# Patient Record
Sex: Female | Born: 1945
Health system: Southern US, Community
[De-identification: ages and names within clinical notes are randomized; demographics above are authoritative.]

## PROBLEM LIST (undated history)

## (undated) DIAGNOSIS — K439 Ventral hernia without obstruction or gangrene: Secondary | ICD-10-CM

## (undated) DIAGNOSIS — N393 Stress incontinence (female) (male): Secondary | ICD-10-CM

## (undated) DIAGNOSIS — R112 Nausea with vomiting, unspecified: Secondary | ICD-10-CM

## (undated) DIAGNOSIS — C801 Malignant (primary) neoplasm, unspecified: Secondary | ICD-10-CM

## (undated) DIAGNOSIS — Z8719 Personal history of other diseases of the digestive system: Secondary | ICD-10-CM

## (undated) DIAGNOSIS — Z9889 Other specified postprocedural states: Secondary | ICD-10-CM

## (undated) DIAGNOSIS — K7689 Other specified diseases of liver: Secondary | ICD-10-CM

## (undated) DIAGNOSIS — L900 Lichen sclerosus et atrophicus: Secondary | ICD-10-CM

## (undated) HISTORY — DX: Lichen sclerosus et atrophicus: L90.0

## (undated) HISTORY — DX: Malignant (primary) neoplasm, unspecified: C80.1

## (undated) HISTORY — DX: Stress incontinence (female) (male): N39.3

## (undated) HISTORY — PX: AUGMENTATION MAMMAPLASTY: SUR837

## (undated) HISTORY — PX: COLONOSCOPY: SHX174

## (undated) HISTORY — PX: FOOT SURGERY: SHX648

---

## 1987-08-09 HISTORY — PX: ABDOMINAL HYSTERECTOMY: SHX81

## 1995-09-09 HISTORY — PX: LIPOSUCTION: SHX10

## 1997-09-10 ENCOUNTER — Ambulatory Visit (HOSPITAL_COMMUNITY): Admission: RE | Admit: 1997-09-10 | Discharge: 1997-09-10 | Payer: Self-pay | Admitting: Obstetrics and Gynecology

## 1998-04-16 ENCOUNTER — Ambulatory Visit (HOSPITAL_COMMUNITY): Admission: RE | Admit: 1998-04-16 | Discharge: 1998-04-16 | Payer: Self-pay | Admitting: Gastroenterology

## 1999-02-22 ENCOUNTER — Other Ambulatory Visit: Admission: RE | Admit: 1999-02-22 | Discharge: 1999-02-22 | Payer: Self-pay | Admitting: Obstetrics and Gynecology

## 2002-03-04 ENCOUNTER — Other Ambulatory Visit: Admission: RE | Admit: 2002-03-04 | Discharge: 2002-03-04 | Payer: Self-pay | Admitting: Obstetrics and Gynecology

## 2004-01-09 ENCOUNTER — Ambulatory Visit (HOSPITAL_COMMUNITY): Admission: RE | Admit: 2004-01-09 | Discharge: 2004-01-09 | Payer: Self-pay | Admitting: Gastroenterology

## 2004-10-06 DIAGNOSIS — L900 Lichen sclerosus et atrophicus: Secondary | ICD-10-CM

## 2004-10-06 HISTORY — DX: Lichen sclerosus et atrophicus: L90.0

## 2005-08-23 ENCOUNTER — Emergency Department (HOSPITAL_COMMUNITY): Admission: EM | Admit: 2005-08-23 | Discharge: 2005-08-23 | Payer: Self-pay | Admitting: Emergency Medicine

## 2006-01-06 DIAGNOSIS — C801 Malignant (primary) neoplasm, unspecified: Secondary | ICD-10-CM

## 2006-01-06 HISTORY — DX: Malignant (primary) neoplasm, unspecified: C80.1

## 2006-01-26 ENCOUNTER — Encounter: Admission: RE | Admit: 2006-01-26 | Discharge: 2006-01-26 | Payer: Self-pay | Admitting: *Deleted

## 2006-02-03 ENCOUNTER — Encounter: Admission: RE | Admit: 2006-02-03 | Discharge: 2006-02-03 | Payer: Self-pay | Admitting: Surgery

## 2006-02-06 ENCOUNTER — Encounter (INDEPENDENT_AMBULATORY_CARE_PROVIDER_SITE_OTHER): Payer: Self-pay | Admitting: Specialist

## 2006-02-06 ENCOUNTER — Encounter: Admission: RE | Admit: 2006-02-06 | Discharge: 2006-02-06 | Payer: Self-pay | Admitting: Surgery

## 2006-02-06 ENCOUNTER — Ambulatory Visit (HOSPITAL_BASED_OUTPATIENT_CLINIC_OR_DEPARTMENT_OTHER): Admission: RE | Admit: 2006-02-06 | Discharge: 2006-02-06 | Payer: Self-pay | Admitting: Surgery

## 2007-02-22 HISTORY — PX: BREAST RECONSTRUCTION: SHX9

## 2008-01-11 ENCOUNTER — Ambulatory Visit: Payer: Self-pay | Admitting: Oncology

## 2008-03-31 ENCOUNTER — Ambulatory Visit: Payer: Self-pay | Admitting: Oncology

## 2008-04-01 LAB — CBC WITH DIFFERENTIAL/PLATELET
EOS%: 4.6 % (ref 0.0–7.0)
LYMPH%: 28 % (ref 14.0–48.0)
MCH: 31.9 pg (ref 26.0–34.0)
MCHC: 35.2 g/dL (ref 32.0–36.0)
MCV: 90.8 fL (ref 81.0–101.0)
MONO%: 9.3 % (ref 0.0–13.0)
Platelets: 248 10*3/uL (ref 145–400)
RBC: 4.44 10*6/uL (ref 3.70–5.32)
RDW: 12.4 % (ref 11.3–14.5)

## 2008-04-01 LAB — MORPHOLOGY: RBC Comments: NORMAL

## 2009-12-06 HISTORY — PX: BREAST SURGERY: SHX581

## 2010-03-03 ENCOUNTER — Encounter: Admission: RE | Admit: 2010-03-03 | Discharge: 2010-03-03 | Payer: Self-pay | Admitting: Radiology

## 2010-03-18 ENCOUNTER — Ambulatory Visit: Payer: Self-pay | Admitting: Oncology

## 2010-05-06 ENCOUNTER — Encounter (INDEPENDENT_AMBULATORY_CARE_PROVIDER_SITE_OTHER): Payer: Self-pay | Admitting: Surgery

## 2010-05-06 ENCOUNTER — Inpatient Hospital Stay (HOSPITAL_COMMUNITY): Admission: RE | Admit: 2010-05-06 | Discharge: 2010-05-10 | Payer: Self-pay | Admitting: Surgery

## 2010-10-21 LAB — CARDIAC PANEL(CRET KIN+CKTOT+MB+TROPI)
CK, MB: 2.4 ng/mL (ref 0.3–4.0)
CK, MB: 3.8 ng/mL (ref 0.3–4.0)
Relative Index: 0.5 (ref 0.0–2.5)
Total CK: 452 U/L — ABNORMAL HIGH (ref 7–177)
Total CK: 515 U/L — ABNORMAL HIGH (ref 7–177)
Troponin I: 0.01 ng/mL (ref 0.00–0.06)
Troponin I: 0.01 ng/mL (ref 0.00–0.06)
Troponin I: 0.02 ng/mL (ref 0.00–0.06)

## 2010-10-21 LAB — COMPREHENSIVE METABOLIC PANEL
Albumin: 2.8 g/dL — ABNORMAL LOW (ref 3.5–5.2)
Alkaline Phosphatase: 56 U/L (ref 39–117)
Alkaline Phosphatase: 97 U/L (ref 39–117)
BUN: 12 mg/dL (ref 6–23)
BUN: 6 mg/dL (ref 6–23)
CO2: 30 mEq/L (ref 19–32)
Chloride: 100 mEq/L (ref 96–112)
Chloride: 104 mEq/L (ref 96–112)
Creatinine, Ser: 0.59 mg/dL (ref 0.4–1.2)
GFR calc non Af Amer: >60 mL/min (ref >60)
GFR calc non Af Amer: >60 mL/min (ref >60)
Glucose, Bld: 128 mg/dL — ABNORMAL HIGH (ref 70–99)
Glucose, Bld: 96 mg/dL (ref 70–99)
Potassium: 3.6 mEq/L (ref 3.5–5.1)
Total Bilirubin: 0.4 mg/dL (ref 0.3–1.2)
Total Bilirubin: 0.5 mg/dL (ref 0.3–1.2)
Total Protein: 6.4 g/dL (ref 6.0–8.3)

## 2010-10-21 LAB — CBC
HCT: 28.4 % — ABNORMAL LOW (ref 36.0–46.0)
HCT: 28.5 % — ABNORMAL LOW (ref 36.0–46.0)
HCT: 31.7 % — ABNORMAL LOW (ref 36.0–46.0)
HCT: 41.5 % (ref 36.0–46.0)
Hemoglobin: 10.8 g/dL — ABNORMAL LOW (ref 12.0–15.0)
Hemoglobin: 9.6 g/dL — ABNORMAL LOW (ref 12.0–15.0)
MCH: 31.1 pg (ref 26.0–34.0)
MCHC: 33.7 g/dL (ref 30.0–36.0)
MCV: 91.2 fL (ref 78.0–100.0)
MCV: 93.1 fL (ref 78.0–100.0)
Platelets: 171 10*3/uL (ref 150–400)
RBC: 3.05 MIL/uL — ABNORMAL LOW (ref 3.87–5.11)
RBC: 3.12 MIL/uL — ABNORMAL LOW (ref 3.87–5.11)
RDW: 12.5 % (ref 11.5–15.5)
RDW: 12.6 % (ref 11.5–15.5)
WBC: 6 10*3/uL (ref 4.0–10.5)
WBC: 8.5 10*3/uL (ref 4.0–10.5)

## 2010-10-21 LAB — BASIC METABOLIC PANEL
CO2: 29 mEq/L (ref 19–32)
GFR calc non Af Amer: >60 mL/min (ref >60)
GFR calc non Af Amer: >60 mL/min (ref >60)
Glucose, Bld: 144 mg/dL — ABNORMAL HIGH (ref 70–99)
Potassium: 3.5 mEq/L (ref 3.5–5.1)
Potassium: 3.7 mEq/L (ref 3.5–5.1)
Sodium: 133 mEq/L — ABNORMAL LOW (ref 135–145)
Sodium: 137 mEq/L (ref 135–145)

## 2010-10-21 LAB — URINALYSIS, ROUTINE W REFLEX MICROSCOPIC
Bilirubin Urine: NEGATIVE
Glucose, UA: NEGATIVE mg/dL
Hgb urine dipstick: NEGATIVE
Specific Gravity, Urine: 1.008 (ref 1.005–1.030)
Urobilinogen, UA: 0.2 mg/dL (ref 0.0–1.0)
pH: 5.5 (ref 5.0–8.0)

## 2010-10-21 LAB — DIFFERENTIAL
Basophils Absolute: 0 10*3/uL (ref 0.0–0.1)
Basophils Relative: 1 % (ref 0–1)
Neutro Abs: 3.6 10*3/uL (ref 1.7–7.7)
Neutrophils Relative %: 59 % (ref 43–77)

## 2010-10-21 LAB — GLUCOSE, CAPILLARY
Glucose-Capillary: 104 mg/dL — ABNORMAL HIGH (ref 70–99)
Glucose-Capillary: 107 mg/dL — ABNORMAL HIGH (ref 70–99)
Glucose-Capillary: 109 mg/dL — ABNORMAL HIGH (ref 70–99)
Glucose-Capillary: 119 mg/dL — ABNORMAL HIGH (ref 70–99)
Glucose-Capillary: 120 mg/dL — ABNORMAL HIGH (ref 70–99)
Glucose-Capillary: 130 mg/dL — ABNORMAL HIGH (ref 70–99)

## 2010-10-21 LAB — SURGICAL PCR SCREEN: Staphylococcus aureus: NEGATIVE

## 2010-12-24 NOTE — Op Note (Signed)
NAMEADISSON, DEAK             ACCOUNT NO.:  [id number]   MEDICAL RECORD NO.:  [id number]          PATIENT TYPE:  AMB   LOCATION:  DSC                          FACILITY:  [facility]   PHYSICIAN:  Currie Paris, M.D.DATE OF BIRTH:  May 28, 1946   DATE OF PROCEDURE:  02/06/2006  DATE OF DISCHARGE:                                 OPERATIVE REPORT   CCS [id number]   PREOPERATIVE DIAGNOSIS:  Ductal carcinoma in situ right breast, upper outer  quadrant.   OPERATION:  Needle guided excision, right breast cancer.   SURGEON:  Dr. Jamey Ripa.   ANESTHESIA:  General.   CLINICAL HISTORY:  Ms. Walsh is a 60-year lady with a recently developed  area of calcifications in the upper outer right breast and a core biopsy  showed DCIS.  After lengthy discussion with the patient she elected to  proceed to needle guided excision with plans for radiation therapy  postoperatively.   DESCRIPTION OF PROCEDURE:  The patient was seen in the holding area and had  no further questions.  The guidewire already been placed in the films were  reviewed.  The right breast was marked as the operative side.   The patient was taken to the operating room and after satisfactory general  anesthesia had been obtained the right breast was prepped and draped.  The  time-out occurred.   The guidewire entered fairly laterally and tracked almost directly medially.  I made a curvilinear incision taking a small ellipse of skin so I could  include the prior biopsy site.  The guidewire went directly through that  biopsy site skin entry.   I raised flaps superiorly, then laterally going deep and then inferiorly.  I  then freed this up off of the chest wall and continued the dissection  medially and went well beyond the skin incision so that I was beyond the  guidewire tip.  This completely excised the area around the guidewire with  exception of a little thin rim of tissue deep over the pectoralis muscle and  her breast  implant.  In looking at the specimen I thought the area of  involvement was fairly close to the tip of the guidewire so once that was  out I took a second piece of tissue around where the guidewire was and to go  further medially and get an area of old hemorrhage which appeared to be from  her prior biopsy site where she had had a fairly large ecchymosis and I  wanted to make sure all that was out as well.  Finally I took a few  millimeters of tissue which was all that was left of the deep margin so that  I was right down on the muscle fascia and chest wall.   I made sure everything was dry.  I put four clips and to mark the  approximate margin of the excision.  I closed thr subcu with 3-0 Vicryl, the  skin with 4-0 Monocryl subcuticular and Dermabond.   Specimen mammogram indicated that we had a good positioning of that the  calcifications within the specimen.  Touch Preps  were not done as the  pathologist apparently put the tissue in Formalin not realizing we wished  touch preps.  We will therefore wait for permanent sections.   The patient tolerated procedure well.  There no operative complications.  All counts were correct.      Currie Paris, M.D.  Electronically Signed     CJS/MEDQ  D:  02/06/2006  T:  02/06/2006  Job:  [id number]   cc:   Loraine Leriche A. Perini, M.D.  Fax: [phone]

## 2011-05-09 HISTORY — PX: BREAST RECONSTRUCTION: SHX9

## 2011-06-02 ENCOUNTER — Ambulatory Visit (HOSPITAL_BASED_OUTPATIENT_CLINIC_OR_DEPARTMENT_OTHER)
Admission: RE | Admit: 2011-06-02 | Discharge: 2011-06-02 | Disposition: A | Discharge status: Home / Self Care | Payer: Medicare Other | Source: Ambulatory Visit | Attending: Plastic Surgery | Admitting: Plastic Surgery

## 2011-06-02 ENCOUNTER — Other Ambulatory Visit: Payer: Self-pay | Admitting: Plastic Surgery

## 2011-06-02 DIAGNOSIS — Z9010 Acquired absence of unspecified breast and nipple: Secondary | ICD-10-CM | POA: Insufficient documentation

## 2011-06-02 DIAGNOSIS — Z4430 Encounter for fitting and adjustment of external breast prosthesis, unspecified breast: Secondary | ICD-10-CM | POA: Insufficient documentation

## 2011-06-02 LAB — POCT HEMOGLOBIN-HEMACUE: Hemoglobin: 14.2 g/dL (ref 12.0–15.0)

## 2011-06-10 NOTE — Op Note (Signed)
NAMERAMANDA, PAULES NO.:  [id number]  MEDICAL RECORD NO.:  [id number]  LOCATION:                                 FACILITY:  PHYSICIAN:  Wayland Denis, DO      DATE OF BIRTH:  1946-05-06  DATE OF PROCEDURE:  06/02/2011 DATE OF DISCHARGE:                              OPERATIVE REPORT   PREOPERATIVE DIAGNOSIS:  Breast cancer.  POSTOPERATIVE DIAGNOSIS:  Breast cancer, status post mastectomies with reconstruction.  PROCEDURES: 1. Removal of bilateral tissue expanders of the breast with placement     of permanent implants. 2. Capsulectomy on the left.  ATTENDING SURGEON:  Wayland Denis, DO  ANESTHESIA:  General.  INDICATION FOR PROCEDURE:  The patient is a 65 year old female who underwent bilateral mastectomies for breast cancer.  She then had a right latissimus muscle reconstruction with expander placement and a left expander placement.  She presents today for removal of the expanders and placement of the implant.  DESCRIPTION OF PROCEDURE:  The patient was seen in the holding room. Risks and complications were reviewed.  Consent was confirmed.  Markings were made.  The patient was then taken to the operating room, placed on the operating room table in the supine position.  General anesthesia was administered.  Once adequate, a time-out was called.  All information was confirmed to be correct.  She was prepped and draped in the usual sterile fashion.  Surgery began on the left breast.  She had an inframammary incision from her previous surgeries.  This incision was used to approach the expander.  The scar was extremely thin and therefore not cut out.  The incision was made with a 15 blade.  The Bovie was then used to dissect down to the capsule and open the capsule. The expander was punctured and the saline was evacuated.  The expander was then removed.  There was lateralization of the expander and it had moved inferiorly.  Therefore, a capsulectomy  was done at the inframammary fold and at the lateral margin in order to raise the inframammary fold more superiorly and close the breast pocket laterally to keep the implant from moving lateral.  This was done with the Bovie to remove the capsule in those areas and then it was re-sutured with 2-0 PDS to itself with a running suture.  The pocket was irrigated with antibiotic solution.  The Sizer was then placed and amount of 540 mL was selected.  The Sizer was removed and the Allergan natural 168 implant was chosen, the size was 5-10, it was prepared according to the manufacture's guideline and was soaked in antibiotic solution.  The air was evacuated.  The implant was placed in the pocket and expanded to 540 mL of normal injectable saline.  The tubing was then used to evacuate any remaining air and then the tubing was removed.  The capsule was closed with 3-0 Vicryl.  The muscle was reapproximated with a Vicryl 3-0 and 4-0 Vicryl was then used to close the soft tissue.  The skin was reapproximated with 5-0 Monocryl.  Dermabond was then applied to the skin.  Attention was then turned to the right breast.  An incision was  made along the superior aspect of the latissimus flap because the patient desire to have it raised.  The skin on the breast was scarred because it had closed by secondary intention after her previous surgery. We were able to excise a portion of this about a cm of this in its thickness and a 6-cm wide in order to raise the flap more superiorly. This was done with a 15 blade to excise the skin and free the flap from the superior flap in order to raise the latissimus.  The decision was made to approach the expander from the inferior medial aspect where I was competent that there was a juncture between the latissimus and the chest wall.  This was approached with the Bovie.  The expander was removed by puncturing it and evacuating the fluid.  A Sizer was placed and inflated to  510 mL.  The Allergan natural 168 480 mL saline filled breast implant was chosen and prepared according to the manufacture guidelines, soaked in antibiotic solution.  The air was evacuated and the implant was placed in the pocket.  The implant was inflated to 510 mL with injectable normal saline.  The latissimus was reattached to the chest wall with 3-0 Vicryl.  The 3-0 Vicryl was used to elevate the latissimus more superiorly by plicating it to the pectoralis.  The deep layers were closed with 3-0 Vicryl and then a 4-0 Vicryl and a running subcuticular 5-0 Monocryl was used to close the skin.  Both pockets were dry.  No drains were placed.  The patient tolerated the procedure well. There were no complications.  ABDs were placed with a breast binder. The patient was awoken and taken to recovery room in stable.     Wayland Denis, DO     CS/MEDQ  D:  06/02/2011  T:  06/03/2011  Job:  [id number]  Electronically Signed by Wayland Denis  on 06/10/2011 09:39:20 AM

## 2012-05-22 ENCOUNTER — Ambulatory Visit
Admission: RE | Admit: 2012-05-22 | Discharge: 2012-05-22 | Disposition: A | Discharge status: Home / Self Care | Payer: Medicare Other | Source: Ambulatory Visit | Attending: Gastroenterology | Admitting: Gastroenterology

## 2012-05-22 ENCOUNTER — Other Ambulatory Visit: Payer: Self-pay | Admitting: Gastroenterology

## 2012-05-22 DIAGNOSIS — R140 Abdominal distension (gaseous): Secondary | ICD-10-CM

## 2012-06-08 LAB — HM COLONOSCOPY: HM Colonoscopy: NORMAL

## 2013-07-23 ENCOUNTER — Encounter: Payer: Self-pay | Admitting: Nurse Practitioner

## 2013-07-25 ENCOUNTER — Ambulatory Visit (INDEPENDENT_AMBULATORY_CARE_PROVIDER_SITE_OTHER): Payer: Medicare Other | Admitting: Nurse Practitioner

## 2013-07-25 ENCOUNTER — Encounter: Payer: Self-pay | Admitting: Nurse Practitioner

## 2013-07-25 VITALS — BP 130/82 | HR 80 | Resp 16 | Ht 64.5 in | Wt 138.0 lb

## 2013-07-25 DIAGNOSIS — D0590 Unspecified type of carcinoma in situ of unspecified breast: Secondary | ICD-10-CM | POA: Insufficient documentation

## 2013-07-25 DIAGNOSIS — N904 Leukoplakia of vulva: Secondary | ICD-10-CM

## 2013-07-25 DIAGNOSIS — E559 Vitamin D deficiency, unspecified: Secondary | ICD-10-CM | POA: Insufficient documentation

## 2013-07-25 DIAGNOSIS — Z01419 Encounter for gynecological examination (general) (routine) without abnormal findings: Secondary | ICD-10-CM

## 2013-07-25 DIAGNOSIS — L940 Localized scleroderma [morphea]: Secondary | ICD-10-CM

## 2013-07-25 DIAGNOSIS — D0591 Unspecified type of carcinoma in situ of right breast: Secondary | ICD-10-CM

## 2013-07-25 MED ORDER — ESTRADIOL 10 MCG VA TABS: 1.0000 | ORAL_TABLET | VAGINAL | Status: DC

## 2013-07-25 MED ORDER — FLUCONAZOLE 150 MG PO TABS: 150.0000 mg | ORAL_TABLET | Freq: Once | ORAL | Status: DC

## 2013-07-25 NOTE — Patient Instructions (Signed)

## 2013-07-25 NOTE — Progress Notes (Signed)
Patient ID: Krista Carney, female   DOB: [birth year], 67 y.o.   MRN: [id number] 67 y.o. G93P2002 Married Caucasian Fe here for annual exam.  No new health problems.  She continues on Vagifem and does use Replens prn.  She joined [employer] and has lost 20 lbs since last year. She feels well.  [place] the rest.  Occasionally has a little vaginal discharge and itching and request a refill of Diflucan.  She has not had a flare of LSA. Mother is 62 1/2 and doing quite well.  She walks on treadmill for 20 minutes every am.  Reads 12 books in 2 weeks.  No LMP recorded. Patient has had a hysterectomy.          Sexually active: yes  The current method of family planning is post menopausal status.    Exercising: yes  Home exercise routine includes walking 3-4 miles everyday, teaches weight training twice weekly. Smoker:  no  Health Maintenance: Pap:  03/04/02, WNL MMG:  Bilateral mastectomy Colonoscopy:  06/2012, normal, repeat 10 years BMD:  2011, normal TDaP:  UTD Labs: PCP   reports that she has never smoked. She has never used smokeless tobacco. She reports that she drinks about 4.2 ounces of alcohol per week. She reports that she does not use illicit drugs.  Past Medical History  Diagnosis Date  . SUI (stress urinary incontinence, female)   . Cancer 01/2006    DCIS Right Breast, lumpectomy  . Lichen sclerosus et atrophicus 10/2004    vulvar biopsy    Past Surgical History  Procedure Laterality Date  . Augmentation mammaplasty Bilateral prior to breast cancer    implants, silicone  . Breast reconstruction Bilateral 02/22/07  . Breast surgery Bilateral 12/2009    mastectomy  . Breast reconstruction Bilateral 05/2011  . Liposuction  2/97    incision repair  . Foot surgery Bilateral 1/97 & 1999    bunioectomy and hammer toe  . Abdominal hysterectomy  1989    abdominoplasty, endo/fibroids/adh  . Colonoscopy  fall 2013    normal recheck in 10 years     Current Outpatient Prescriptions  Medication Sig Dispense Refill  . aspirin EC 81 MG tablet Take 81 mg by mouth 2 (two) times a week.      . butalbital-acetaminophen-caffeine (FIORICET, ESGIC) 50-325-40 MG per tablet Take 1 tablet by mouth 2 (two) times daily as needed for headache.      . Coenzyme Q10 (CO Q 10 PO) Take 1 tablet by mouth daily.      Marland Kitchen dexlansoprazole (DEXILANT) 60 MG capsule Take 60 mg by mouth daily.      . Estradiol (VAGIFEM) 10 MCG TABS vaginal tablet Place 1 tablet (10 mcg total) vaginally 2 (two) times a week.  24 tablet  3  . traMADol (ULTRAM) 50 MG tablet Take 50 mg by mouth every 6 (six) hours as needed.      . vitamin C (ASCORBIC ACID) 500 MG tablet Take 500 mg by mouth daily.      Marland Kitchen zolpidem (AMBIEN) 5 MG tablet Take 2.5 mg by mouth at bedtime as needed for sleep.      . fluconazole (DIFLUCAN) 150 MG tablet Take 1 tablet (150 mg total) by mouth once. Take one tablet.  Repeat in 48 hours if symptoms are not completely resolved.  2 tablet  0  . MAGNESIUM PO Take 1 tablet by mouth daily.  No current facility-administered medications for this visit.    Family History  Problem Relation Age of Onset  . Osteoporosis Sister   . Tremor Sister   . Thyroid disease Mother   . Tremor Mother   . Tremor Sister   . Tremor Sister     ROS:  Pertinent items are noted in HPI.  Otherwise, a comprehensive ROS was negative.  Exam:   BP 130/82  Pulse 80  Resp 16  Ht 5' 4.5" (1.638 m)  Wt 138 lb (62.596 kg)  BMI 23.33 kg/m2 Height: 5' 4.5" (163.8 cm)  Ht Readings from Last 3 Encounters:  07/25/13 5' 4.5" (1.638 m)    General appearance: alert, cooperative and appears stated age Head: Normocephalic, without obvious abnormality, atraumatic Neck: no adenopathy, supple, symmetrical, trachea midline and thyroid normal to inspection and palpation Lungs: clear to auscultation bilaterally Breasts: positive findings: implants bilaterally, with bilateral mastectomy, then  implants and reconstruction surgerical scars. Heart: regular rate and rhythm Abdomen: soft, non-tender; no masses,  no organomegaly Extremities: extremities normal, atraumatic, no cyanosis or edema Skin: Skin color, texture, turgor normal. No rashes or lesions Lymph nodes: Cervical, supraclavicular, and axillary nodes normal. No abnormal inguinal nodes palpated Neurologic: Grossly normal   Pelvic: External genitalia:  no lesions, no flare of LSA              Urethra:  normal appearing urethra with no masses, tenderness or lesions              Bartholin's and Skene's: normal                 Vagina: normal appearing vagina with normal color and discharge, no lesions              Cervix: absent              Pap taken: no Bimanual Exam:  Uterus:  uterus absent              Adnexa: no mass, fullness, tenderness               Rectovaginal: Confirms               Anus:  normal sphincter tone, no lesions  A:  Well Woman with normal exam  S/P Bilateral mastectomy following DCIS on right 6/07 ER/ PR negative, treated with radiation. Later reconstruction bilaterally X 2   S/P TAH with abdominoplasty secondary to endo, fibroids, adhesions 1989  History of LSA - no current flare  P:   Pap smear as per guidelines  not indicated  Mammogram not indicated  Refill on Diflucan X 2 if needed  Reviewed use of OTC products to help with atrophy  Refill on Vagifem twice weekly  Prior authorization from surgeon and oncologist (Magrinat) that she could use vaginal estrogen.  She is again reviewed with potential risk and precautions.  Discussed potential risks of DVT, CVA, cancer, etc  She will return for Vit D - order is placed  Counseled on breast self exam, adequate intake of calcium and vitamin D, diet and exercise, Kegel's exercises return annually or prn  An After Visit Summary was printed and given to the patient.

## 2013-07-25 NOTE — Progress Notes (Signed)
Reviewed personally.  M. Suzanne Renald Haithcock, MD.  

## 2013-08-21 ENCOUNTER — Other Ambulatory Visit: Payer: Self-pay | Admitting: Nurse Practitioner

## 2013-08-21 NOTE — Telephone Encounter (Signed)
eScribe request from [facility] for refill on VAGIFEM Last filled - 07/25/13, #24 X 3 RF Last AEX - 07/25/13 Next AEX - 08/13/13 RX denied as this has already been done.

## 2013-08-28 ENCOUNTER — Other Ambulatory Visit: Payer: Medicare Other

## 2013-08-28 DIAGNOSIS — E559 Vitamin D deficiency, unspecified: Secondary | ICD-10-CM

## 2013-08-29 LAB — VITAMIN D 25 HYDROXY (VIT D DEFICIENCY, FRACTURES): Vit D, 25-Hydroxy: 62 ng/mL (ref 30–89)

## 2014-06-09 ENCOUNTER — Encounter: Payer: Self-pay | Admitting: Nurse Practitioner

## 2014-07-29 ENCOUNTER — Ambulatory Visit (INDEPENDENT_AMBULATORY_CARE_PROVIDER_SITE_OTHER): Payer: Medicare Other | Admitting: Nurse Practitioner

## 2014-07-29 ENCOUNTER — Encounter: Payer: Self-pay | Admitting: Nurse Practitioner

## 2014-07-29 VITALS — BP 136/84 | HR 64 | Ht 64.0 in | Wt 143.0 lb

## 2014-07-29 DIAGNOSIS — Z01419 Encounter for gynecological examination (general) (routine) without abnormal findings: Secondary | ICD-10-CM

## 2014-07-29 MED ORDER — ESTROGENS, CONJUGATED 0.625 MG/GM VA CREA: TOPICAL_CREAM | VAGINAL | Status: DC

## 2014-07-29 MED ORDER — FLUCONAZOLE 150 MG PO TABS: 150.0000 mg | ORAL_TABLET | Freq: Once | ORAL | Status: DC

## 2014-07-29 MED ORDER — CLOBETASOL PROP EMOLLIENT BASE 0.05 % EX CREA: 0.0500 "application " | TOPICAL_CREAM | Freq: Two times a day (BID) | CUTANEOUS | Status: DC

## 2014-07-29 NOTE — Progress Notes (Signed)
Patient ID: Krista Carney, female   DOB: Jul 28, 1946, 68 y.o.   MRN: [id number]  68 y.o. G23P2002 Married Caucasian Fe here for annual exam.  Some flare of LSA.  Uses Vagifem on and off this year but does better with Premarin she thinks.  Wants a refill on this.  From time to time needs Diflucan to calm down the LSA.  Second round of back pain and sees chiropractor.  Saw a neuro muscular massage therapist.    Patient's last menstrual period was 10/07/1987.        Sexually active: yes  The current method of family planning is S/P TAH.  Exercising: yes Home exercise routine includes walking 3-4 miles everyday, teaches weight training twice weekly. Smoker: no  Health Maintenance: Pap: 03/04/02, WNL MMG: Bilateral mastectomy, history of DCIS right breast 01/2006 Colonoscopy: 06/2012, normal, repeat 10 years BMD: 12/2013, Dr. Joylene Draft, normal per patient TDaP: UTD Shingles vaccine 2011 Prevnar 13 - 12/14 Labs: PCP   reports that she has never smoked. She has never used smokeless tobacco. She reports that she drinks about 4.2 oz of alcohol per week. She reports that she does not use illicit drugs.  Past Medical History  Diagnosis Date  . SUI (stress urinary incontinence, female)   . Cancer 01/2006    DCIS Right Breast, lumpectomy  . Lichen sclerosus et atrophicus 10/2004    vulvar biopsy    Past Surgical History  Procedure Laterality Date  . Augmentation mammaplasty Bilateral prior to breast cancer    implants, silicone  . Breast reconstruction Bilateral 02/22/07  . Breast surgery Bilateral 12/2009    mastectomy  . Breast reconstruction Bilateral 05/2011  . Liposuction  2/97    incision repair  . Foot surgery Bilateral 1/97 & 1999    bunioectomy and hammer toe  . Abdominal hysterectomy  1989    abdominoplasty, endo/fibroids/adh  . Colonoscopy  fall 2013    normal recheck in 10 years    Current Outpatient Prescriptions  Medication Sig Dispense Refill  . aspirin EC 81 MG  tablet Take 81 mg by mouth 2 (two) times a week.    . butalbital-acetaminophen-caffeine (FIORICET, ESGIC) 50-325-40 MG per tablet Take 1 tablet by mouth 2 (two) times daily as needed for headache.    . Coenzyme Q10 (CO Q 10 PO) Take 1 tablet by mouth daily.    Marland Kitchen dexlansoprazole (DEXILANT) 60 MG capsule Take 60 mg by mouth daily.    . fluconazole (DIFLUCAN) 150 MG tablet Take 1 tablet (150 mg total) by mouth once. Take one tablet.  Repeat in 48 hours if symptoms are not completely resolved. 2 tablet 0  . MAGNESIUM PO Take 1 tablet by mouth daily.    . traMADol (ULTRAM) 50 MG tablet Take 50 mg by mouth every 6 (six) hours as needed.    . vitamin C (ASCORBIC ACID) 500 MG tablet Take 1,000 mg by mouth daily.     Marland Kitchen zolpidem (AMBIEN) 5 MG tablet Take 2.5 mg by mouth at bedtime as needed for sleep.    . Clobetasol Prop Emollient Base 0.05 % emollient cream Apply 6.30 application topically 2 (two) times daily. 60 g 2  . conjugated estrogens (PREMARIN) vaginal cream Use 1/2 g vaginally twice weekly 42.5 g 3   No current facility-administered medications for this visit.    Family History  Problem Relation Age of Onset  . Osteoporosis Sister   . Tremor Sister   . Thyroid disease Mother   .  Tremor Mother   . Tremor Sister   . Tremor Sister     ROS:  Pertinent items are noted in HPI.  Otherwise, a comprehensive ROS was negative.  Exam:   BP 136/84 mmHg  Pulse 64  Ht 5\' 4"  (1.626 m)  Wt 143 lb (64.864 kg)  BMI 24.53 kg/m2  LMP 10/07/1987 Height: 5\' 4"  (162.6 cm)  Ht Readings from Last 3 Encounters:  07/29/14 5\' 4"  (1.626 m)  07/25/13 5' 4.5" (1.638 m)    General appearance: alert, cooperative and appears stated age Head: Normocephalic, without obvious abnormality, atraumatic Neck: no adenopathy, supple, symmetrical, trachea midline and thyroid normal to inspection and palpation Lungs: clear to auscultation bilaterally Breasts: bilateral implants that are hard, no obvious mass Heart:  regular rate and rhythm Abdomen: soft, non-tender; no masses,  no organomegaly Extremities: extremities normal, atraumatic, no cyanosis or edema Skin: Skin color, texture, turgor normal. No rashes or lesions Lymph nodes: Cervical, supraclavicular, and axillary nodes normal. No abnormal inguinal nodes palpated Neurologic: Grossly normal   Pelvic: External genitalia:  no lesions              Urethra:  normal appearing urethra with no masses, tenderness or lesions              Bartholin's and Skene's: normal                 Vagina: normal appearing vagina with normal color and discharge, no lesions              Cervix: absent              Pap taken: No. Bimanual Exam:  Uterus:  uterus absent              Adnexa: no mass, fullness, tenderness               Rectovaginal: Confirms               Anus:  normal sphincter tone, no lesions  A:  Well Woman with normal exam  S/P Bilateral mastectomy following DCIS on right 6/07 ER/ PR negative, treated with radiation. Later reconstruction bilaterally X 2  S/P TAH with abdominoplasty secondary to endo, fibroids, adhesions 1989 History of LSA - slight current flare  History of low back pain   P:   Reviewed health and wellness pertinent to exam  Pap smear not taken today  Mammogram is not indicated  Refill on Temovate cream - has some for now  Refill on Premarin cream to use at the meatus and introitus  Counseled with risk of CVA, DVT,cancer, etc.  Counseled on breast self exam, use and side effects of HRT, adequate intake of calcium and vitamin D, diet and exercise, Kegel's exercises return annually or prn  An After Visit Summary was printed and given to the patient.

## 2014-07-29 NOTE — Patient Instructions (Signed)

## 2014-08-04 NOTE — Progress Notes (Signed)
Encounter reviewed by Dr. Gurjot Brisco Silva.  

## 2014-08-11 ENCOUNTER — Ambulatory Visit: Payer: Medicare Other | Admitting: Nurse Practitioner

## 2014-08-20 ENCOUNTER — Ambulatory Visit (HOSPITAL_COMMUNITY)
Admission: RE | Admit: 2014-08-20 | Discharge: 2014-08-20 | Disposition: A | Discharge status: Home / Self Care | Payer: PPO | Source: Ambulatory Visit | Attending: Vascular Surgery | Admitting: Vascular Surgery

## 2014-08-20 ENCOUNTER — Other Ambulatory Visit (HOSPITAL_COMMUNITY): Payer: Self-pay | Admitting: Internal Medicine

## 2014-08-20 DIAGNOSIS — M79606 Pain in leg, unspecified: Secondary | ICD-10-CM

## 2015-07-31 ENCOUNTER — Ambulatory Visit (INDEPENDENT_AMBULATORY_CARE_PROVIDER_SITE_OTHER): Payer: PPO | Admitting: Nurse Practitioner

## 2015-07-31 ENCOUNTER — Encounter: Payer: Self-pay | Admitting: Nurse Practitioner

## 2015-07-31 VITALS — BP 134/78 | HR 72 | Ht 64.25 in | Wt 143.0 lb

## 2015-07-31 DIAGNOSIS — Z0000 Encounter for general adult medical examination without abnormal findings: Secondary | ICD-10-CM

## 2015-07-31 DIAGNOSIS — Z01419 Encounter for gynecological examination (general) (routine) without abnormal findings: Secondary | ICD-10-CM | POA: Diagnosis not present

## 2015-07-31 DIAGNOSIS — R300 Dysuria: Secondary | ICD-10-CM

## 2015-07-31 LAB — POCT URINALYSIS DIPSTICK
BILIRUBIN UA: NEGATIVE
Blood, UA: NEGATIVE
Glucose, UA: NEGATIVE
Ketones, UA: NEGATIVE
LEUKOCYTES UA: NEGATIVE
NITRITE UA: NEGATIVE
PH UA: 7
Protein, UA: NEGATIVE
UROBILINOGEN UA: NEGATIVE

## 2015-07-31 MED ORDER — CLOBETASOL PROP EMOLLIENT BASE 0.05 % EX CREA: 0.0500 "application " | TOPICAL_CREAM | Freq: Two times a day (BID) | CUTANEOUS | Status: DC

## 2015-07-31 NOTE — Progress Notes (Signed)
Patient ID: Krista Carney, female   DOB: 1946-06-01, 69 y.o.   MRN: [id number]  69 y.o. G27P2002 Married  Caucasian Fe here for annual exam.  No new health problems.   Patient's last menstrual period was 10/07/1987 (approximate).          Sexually active: Yes.    The current method of family planning is status post hysterectomy.    Exercising: Yes.    walking and stretching Smoker:  no  Health Maintenance: Pap:  03/04/02, Negative MMG:  Not indicated, last Mammo 03/2010 and since then bilateral mastectomies Colonoscopy:  06/2012, normal, repeat in 10 years BMD:   12/2014, normal TD:  UTD with PCP, TDaP was recommended Shingles: Completed with PCP, unsure date Pneumonia: Completed with PCP, unsure date Hep C and HIV: done today Labs:  PCP takes care of labs  Urine: negative   reports that she has never smoked. She has never used smokeless tobacco. She reports that she drinks about 4.2 oz of alcohol per week. She reports that she does not use illicit drugs.  Past Medical History  Diagnosis Date  . SUI (stress urinary incontinence, female)   . Cancer ([place]) 01/2006    DCIS Right Breast, lumpectomy  . Lichen sclerosus et atrophicus 10/2004    vulvar biopsy    Past Surgical History  Procedure Laterality Date  . Augmentation mammaplasty Bilateral prior to breast cancer    implants, silicone  . Breast reconstruction Bilateral 02/22/07  . Breast surgery Bilateral 12/2009    mastectomy  . Breast reconstruction Bilateral 05/2011  . Liposuction  2/97    incision repair  . Foot surgery Bilateral 1/97 & 1999    bunioectomy and hammer toe  . Abdominal hysterectomy  1989    abdominoplasty, endo/fibroids/adh  . Colonoscopy  fall 2013    normal recheck in 10 years    Current Outpatient Prescriptions  Medication Sig Dispense Refill  . aspirin EC 81 MG tablet Take 81 mg by mouth 2 (two) times a week.    . butalbital-acetaminophen-caffeine (FIORICET, ESGIC) 50-325-40 MG per tablet Take 1  tablet by mouth 2 (two) times daily as needed for headache.    . Clobetasol Prop Emollient Base 0.05 % emollient cream Apply [id number] application topically 2 (two) times daily. 60 g 2  . Coenzyme Q10 (CO Q 10 PO) Take 1 tablet by mouth daily.    Marland Kitchen MAGNESIUM PO Take 1 tablet by mouth daily.    . traMADol (ULTRAM) 50 MG tablet Take 50 mg by mouth every 6 (six) hours as needed.    . vitamin C (ASCORBIC ACID) 500 MG tablet Take 1,000 mg by mouth daily.     Marland Kitchen zolpidem (AMBIEN) 5 MG tablet Take 2.5 mg by mouth at bedtime as needed for sleep.     No current facility-administered medications for this visit.    Family History  Problem Relation Age of Onset  . Osteoporosis Sister   . Tremor Sister   . Thyroid disease Mother   . Tremor Mother   . Tremor Sister   . Tremor Sister     ROS:  Pertinent items are noted in HPI.  Otherwise, a comprehensive ROS was negative.  Exam:   BP 134/78 mmHg  Pulse 72  Ht 5' 4.25" (1.632 m)  Wt 143 lb (64.864 kg)  BMI 24.35 kg/m2  LMP 10/07/1987 (Approximate) Height: 5' 4.25" (163.2 cm) Ht Readings from Last 3 Encounters:  07/31/15 5' 4.25" (1.632 m)  07/29/14 5'  4" (1.626 m)  07/25/13 5' 4.5" (1.638 m)    General appearance: alert, cooperative and appears stated age Head: Normocephalic, without obvious abnormality, atraumatic Neck: no adenopathy, supple, symmetrical, trachea midline and thyroid normal to inspection and palpation Lungs: clear to auscultation bilaterally Breasts: breast implants without new mass Heart: regular rate and rhythm Abdomen: soft, non-tender; no masses,  no organomegaly Extremities: extremities normal, atraumatic, no cyanosis or edema Skin: Skin color, texture, turgor normal. No rashes or lesions Lymph nodes: Cervical, supraclavicular, and axillary nodes normal. No abnormal inguinal nodes palpated Neurologic: Grossly normal   Pelvic: External genitalia:  no lesions              Urethra:  normal appearing urethra with no  masses, tenderness or lesions              Bartholin's and Skene's: normal                 Vagina: normal appearing vagina with normal color and discharge, no lesions              Cervix: absent              Pap taken: No. Bimanual Exam:  Uterus:  uterus absent              Adnexa: no mass, fullness, tenderness               Rectovaginal: Confirms               Anus:  normal sphincter tone, no lesions  Chaperone present: no  A:  Well Woman with normal exam  S/P Bilateral mastectomy following DCIS on right 6/07 ER/ PR negative, treated with radiation. Later reconstruction bilaterally X 2  S/P TAH with abdominoplasty secondary to endo, fibroids, adhesions 1989 History of LSA - slight current flare History of low back pain  R/O UTI   P:   Reviewed health and wellness pertinent to exam  Pap smear as above  Will get routine labs and urine culture to R/O infection  Counseled on breast self exam, adequate intake of calcium and vitamin D, diet and exercise return annually or prn  An After Visit Summary was printed and given to the patient.

## 2015-07-31 NOTE — Patient Instructions (Addendum)

## 2015-08-01 LAB — URINE CULTURE
COLONY COUNT: NO GROWTH
ORGANISM ID, BACTERIA: NO GROWTH

## 2015-08-01 LAB — HIV ANTIBODY (ROUTINE TESTING W REFLEX): HIV: NONREACTIVE

## 2015-08-01 LAB — HEPATITIS C ANTIBODY: HCV Ab: NEGATIVE

## 2015-08-06 NOTE — Progress Notes (Signed)
Encounter reviewed Oneka Parada, MD   

## 2015-08-19 DIAGNOSIS — R52 Pain, unspecified: Secondary | ICD-10-CM | POA: Diagnosis not present

## 2015-08-19 DIAGNOSIS — Z6824 Body mass index (BMI) 24.0-24.9, adult: Secondary | ICD-10-CM | POA: Diagnosis not present

## 2015-08-19 DIAGNOSIS — H6123 Impacted cerumen, bilateral: Secondary | ICD-10-CM | POA: Diagnosis not present

## 2015-08-19 DIAGNOSIS — J209 Acute bronchitis, unspecified: Secondary | ICD-10-CM | POA: Diagnosis not present

## 2015-08-19 DIAGNOSIS — R509 Fever, unspecified: Secondary | ICD-10-CM | POA: Diagnosis not present

## 2015-09-09 DIAGNOSIS — E784 Other hyperlipidemia: Secondary | ICD-10-CM | POA: Diagnosis not present

## 2015-09-09 DIAGNOSIS — R7301 Impaired fasting glucose: Secondary | ICD-10-CM | POA: Diagnosis not present

## 2015-09-16 DIAGNOSIS — K648 Other hemorrhoids: Secondary | ICD-10-CM | POA: Diagnosis not present

## 2015-09-16 DIAGNOSIS — M5136 Other intervertebral disc degeneration, lumbar region: Secondary | ICD-10-CM | POA: Diagnosis not present

## 2015-09-16 DIAGNOSIS — K589 Irritable bowel syndrome without diarrhea: Secondary | ICD-10-CM | POA: Diagnosis not present

## 2015-09-16 DIAGNOSIS — N952 Postmenopausal atrophic vaginitis: Secondary | ICD-10-CM | POA: Diagnosis not present

## 2015-09-16 DIAGNOSIS — B0229 Other postherpetic nervous system involvement: Secondary | ICD-10-CM | POA: Diagnosis not present

## 2015-09-16 DIAGNOSIS — Z0000 Encounter for general adult medical examination without abnormal findings: Secondary | ICD-10-CM | POA: Diagnosis not present

## 2015-09-16 DIAGNOSIS — Z1389 Encounter for screening for other disorder: Secondary | ICD-10-CM | POA: Diagnosis not present

## 2015-09-16 DIAGNOSIS — Z1212 Encounter for screening for malignant neoplasm of rectum: Secondary | ICD-10-CM | POA: Diagnosis not present

## 2015-09-16 DIAGNOSIS — Z6824 Body mass index (BMI) 24.0-24.9, adult: Secondary | ICD-10-CM | POA: Diagnosis not present

## 2015-09-16 DIAGNOSIS — R7301 Impaired fasting glucose: Secondary | ICD-10-CM | POA: Diagnosis not present

## 2015-09-16 DIAGNOSIS — E784 Other hyperlipidemia: Secondary | ICD-10-CM | POA: Diagnosis not present

## 2015-09-16 DIAGNOSIS — R51 Headache: Secondary | ICD-10-CM | POA: Diagnosis not present

## 2015-09-16 DIAGNOSIS — Z23 Encounter for immunization: Secondary | ICD-10-CM | POA: Diagnosis not present

## 2015-11-18 DIAGNOSIS — H25013 Cortical age-related cataract, bilateral: Secondary | ICD-10-CM | POA: Diagnosis not present

## 2015-12-17 DIAGNOSIS — X32XXXA Exposure to sunlight, initial encounter: Secondary | ICD-10-CM | POA: Diagnosis not present

## 2015-12-17 DIAGNOSIS — D2272 Melanocytic nevi of left lower limb, including hip: Secondary | ICD-10-CM | POA: Diagnosis not present

## 2015-12-17 DIAGNOSIS — Z08 Encounter for follow-up examination after completed treatment for malignant neoplasm: Secondary | ICD-10-CM | POA: Diagnosis not present

## 2015-12-17 DIAGNOSIS — Z85828 Personal history of other malignant neoplasm of skin: Secondary | ICD-10-CM | POA: Diagnosis not present

## 2015-12-17 DIAGNOSIS — D2262 Melanocytic nevi of left upper limb, including shoulder: Secondary | ICD-10-CM | POA: Diagnosis not present

## 2015-12-17 DIAGNOSIS — L570 Actinic keratosis: Secondary | ICD-10-CM | POA: Diagnosis not present

## 2015-12-17 DIAGNOSIS — D2261 Melanocytic nevi of right upper limb, including shoulder: Secondary | ICD-10-CM | POA: Diagnosis not present

## 2015-12-17 DIAGNOSIS — L821 Other seborrheic keratosis: Secondary | ICD-10-CM | POA: Diagnosis not present

## 2015-12-17 DIAGNOSIS — D2271 Melanocytic nevi of right lower limb, including hip: Secondary | ICD-10-CM | POA: Diagnosis not present

## 2015-12-17 DIAGNOSIS — L814 Other melanin hyperpigmentation: Secondary | ICD-10-CM | POA: Diagnosis not present

## 2015-12-17 DIAGNOSIS — D485 Neoplasm of uncertain behavior of skin: Secondary | ICD-10-CM | POA: Diagnosis not present

## 2015-12-17 DIAGNOSIS — D225 Melanocytic nevi of trunk: Secondary | ICD-10-CM | POA: Diagnosis not present

## 2015-12-23 DIAGNOSIS — M25562 Pain in left knee: Secondary | ICD-10-CM | POA: Diagnosis not present

## 2016-06-13 DIAGNOSIS — D225 Melanocytic nevi of trunk: Secondary | ICD-10-CM | POA: Diagnosis not present

## 2016-06-13 DIAGNOSIS — Z85828 Personal history of other malignant neoplasm of skin: Secondary | ICD-10-CM | POA: Diagnosis not present

## 2016-06-13 DIAGNOSIS — L814 Other melanin hyperpigmentation: Secondary | ICD-10-CM | POA: Diagnosis not present

## 2016-06-13 DIAGNOSIS — B079 Viral wart, unspecified: Secondary | ICD-10-CM | POA: Diagnosis not present

## 2016-06-13 DIAGNOSIS — L578 Other skin changes due to chronic exposure to nonionizing radiation: Secondary | ICD-10-CM | POA: Diagnosis not present

## 2016-06-13 DIAGNOSIS — X32XXXA Exposure to sunlight, initial encounter: Secondary | ICD-10-CM | POA: Diagnosis not present

## 2016-06-13 DIAGNOSIS — Z08 Encounter for follow-up examination after completed treatment for malignant neoplasm: Secondary | ICD-10-CM | POA: Diagnosis not present

## 2016-06-13 DIAGNOSIS — L309 Dermatitis, unspecified: Secondary | ICD-10-CM | POA: Diagnosis not present

## 2016-06-13 DIAGNOSIS — D2261 Melanocytic nevi of right upper limb, including shoulder: Secondary | ICD-10-CM | POA: Diagnosis not present

## 2016-06-13 DIAGNOSIS — D2271 Melanocytic nevi of right lower limb, including hip: Secondary | ICD-10-CM | POA: Diagnosis not present

## 2016-06-13 DIAGNOSIS — D2262 Melanocytic nevi of left upper limb, including shoulder: Secondary | ICD-10-CM | POA: Diagnosis not present

## 2016-06-13 DIAGNOSIS — L570 Actinic keratosis: Secondary | ICD-10-CM | POA: Diagnosis not present

## 2016-06-13 DIAGNOSIS — D485 Neoplasm of uncertain behavior of skin: Secondary | ICD-10-CM | POA: Diagnosis not present

## 2016-06-13 DIAGNOSIS — C50912 Malignant neoplasm of unspecified site of left female breast: Secondary | ICD-10-CM | POA: Diagnosis not present

## 2016-06-13 DIAGNOSIS — D2272 Melanocytic nevi of left lower limb, including hip: Secondary | ICD-10-CM | POA: Diagnosis not present

## 2016-06-13 DIAGNOSIS — L821 Other seborrheic keratosis: Secondary | ICD-10-CM | POA: Diagnosis not present

## 2016-07-05 DIAGNOSIS — J0100 Acute maxillary sinusitis, unspecified: Secondary | ICD-10-CM | POA: Diagnosis not present

## 2016-07-05 DIAGNOSIS — Z6825 Body mass index (BMI) 25.0-25.9, adult: Secondary | ICD-10-CM | POA: Diagnosis not present

## 2016-07-05 DIAGNOSIS — R05 Cough: Secondary | ICD-10-CM | POA: Diagnosis not present

## 2016-07-20 DIAGNOSIS — Z23 Encounter for immunization: Secondary | ICD-10-CM | POA: Diagnosis not present

## 2016-08-16 ENCOUNTER — Ambulatory Visit (INDEPENDENT_AMBULATORY_CARE_PROVIDER_SITE_OTHER): Payer: PPO | Admitting: Nurse Practitioner

## 2016-08-16 ENCOUNTER — Encounter: Payer: Self-pay | Admitting: Nurse Practitioner

## 2016-08-16 VITALS — BP 134/72 | HR 64 | Ht 64.0 in | Wt 147.0 lb

## 2016-08-16 DIAGNOSIS — Z171 Estrogen receptor negative status [ER-]: Secondary | ICD-10-CM

## 2016-08-16 DIAGNOSIS — C50211 Malignant neoplasm of upper-inner quadrant of right female breast: Secondary | ICD-10-CM

## 2016-08-16 DIAGNOSIS — Z01411 Encounter for gynecological examination (general) (routine) with abnormal findings: Secondary | ICD-10-CM

## 2016-08-16 DIAGNOSIS — N904 Leukoplakia of vulva: Secondary | ICD-10-CM | POA: Diagnosis not present

## 2016-08-16 MED ORDER — CLOBETASOL PROP EMOLLIENT BASE 0.05 % EX CREA: 0.0500 "application " | TOPICAL_CREAM | Freq: Two times a day (BID) | CUTANEOUS | 2 refills | Status: DC

## 2016-08-16 NOTE — Patient Instructions (Signed)

## 2016-08-16 NOTE — Progress Notes (Signed)
Patient ID: Krista Carney, female   DOB: 1946-05-03, 71 y.o.   MRN: [id number]  72 y.o. G1P2002 Married  Caucasian Fe here for annual exam.  Pt complains of some vulvar irritation.  She has not used Clobetasol for some time.  Thought maybe related to being just very dry.  Not SA. No discharge.  No new health problems.  Patient's last menstrual period was 10/07/1987 (approximate).          Sexually active: Yes.    The current method of family planning is status post hysterectomy (OV remain)    Exercising: Yes.    Home exercise routine includes walking, weights and stretching. Smoker:  no  Health Maintenance: Pap:  03/04/02, Negative MMG:  Not indicated, last Mammo 03/2010 and since then bilateral mastectomies Colonoscopy:  06/2012, normal, repeat in 10 years BMD: 2017, Normal per patient, Dr. Joylene Draft TDaP: 12/15/08, 07/20/2016 Shingles: 11/15/07 Pneumonia: 07/08/13 Prevnar-13, 12/10/09 Hep C: 07/31/15 HIV: Not indicated due to age Labs: PCP takes care of all labs   reports that she has never smoked. She has never used smokeless tobacco. She reports that she drinks about 4.2 oz of alcohol per week . She reports that she does not use drugs.  Past Medical History:  Diagnosis Date  . Cancer ([place]) 01/2006   DCIS Right Breast, lumpectomy  . Lichen sclerosus et atrophicus 10/2004   vulvar biopsy  . SUI (stress urinary incontinence, female)     Past Surgical History:  Procedure Laterality Date  . ABDOMINAL HYSTERECTOMY  1989   abdominoplasty, endo/fibroids/adh  . AUGMENTATION MAMMAPLASTY Bilateral prior to breast cancer   implants, silicone  . BREAST RECONSTRUCTION Bilateral 02/22/07  . BREAST RECONSTRUCTION Bilateral 05/2011  . BREAST SURGERY Bilateral 12/2009   mastectomy  . COLONOSCOPY  fall 2013   normal recheck in 10 years  . FOOT SURGERY Bilateral 1/97 & 1999   bunioectomy and hammer toe  . LIPOSUCTION  2/97   incision repair    Current Outpatient Prescriptions  Medication Sig  Dispense Refill  . aspirin EC 81 MG tablet Take 81 mg by mouth 2 (two) times a week.    . butalbital-acetaminophen-caffeine (FIORICET, ESGIC) 50-325-40 MG per tablet Take 1 tablet by mouth 2 (two) times daily as needed for headache.    . Clobetasol Prop Emollient Base 0.05 % emollient cream Apply [id number] application topically 2 (two) times daily. 60 g 2  . Coenzyme Q10 (CO Q 10 PO) Take 1 tablet by mouth daily.    Marland Kitchen MAGNESIUM PO Take 1 tablet by mouth daily.    . traMADol (ULTRAM) 50 MG tablet Take 50 mg by mouth every 6 (six) hours as needed.    . vitamin C (ASCORBIC ACID) 500 MG tablet Take 1,000 mg by mouth daily.     Marland Kitchen zolpidem (AMBIEN) 5 MG tablet Take 2.5 mg by mouth at bedtime as needed for sleep.     No current facility-administered medications for this visit.     Family History  Problem Relation Age of Onset  . Osteoporosis Sister   . Tremor Sister   . Thyroid disease Mother   . Tremor Mother   . Tremor Sister   . Tremor Sister     ROS:  Pertinent items are noted in HPI.  Otherwise, a comprehensive ROS was negative.  Exam:   BP 134/72 (BP Location: Right Arm, Patient Position: Sitting, Cuff Size: Normal)   Pulse 64   Ht 5\' 4"  (1.626 m)   Wt  147 lb (66.7 kg)   LMP 10/07/1987 (Approximate)   BMI 25.23 kg/m  Height: 5\' 4"  (162.6 cm) Ht Readings from Last 3 Encounters:  08/16/16 5\' 4"  (1.626 m)  07/31/15 5' 4.25" (1.632 m)  07/29/14 5\' 4"  (1.626 m)    General appearance: alert, cooperative and appears stated age Head: Normocephalic, without obvious abnormality, atraumatic Neck: no adenopathy, supple, symmetrical, trachea midline and thyroid normal to inspection and palpation Lungs: clear to auscultation bilaterally Breasts: positive findings: implants bilaterally and with surgical and radiation changes = implant is worse on right at site of previous cancer. Heart: regular rate and rhythm Abdomen: soft, non-tender; no masses,  no organomegaly Extremities: extremities  normal, atraumatic, no cyanosis or edema Skin: Skin color, texture, turgor normal. No rashes or lesions Lymph nodes: Cervical, supraclavicular, and axillary nodes normal. No abnormal inguinal nodes palpated Neurologic: Grossly normal   Pelvic: External genitalia:  Lesions with current flare of LSA along both labia minora              Urethra:  normal appearing urethra with no masses, tenderness or lesions              Bartholin's and Skene's: normal                 Vagina: normal appearing vagina with normal color and discharge, no lesions              Cervix: absent              Pap taken: No. Bimanual Exam:  Uterus:  uterus absent              Adnexa: no mass, fullness, tenderness               Rectovaginal: Confirms               Anus:  normal sphincter tone, no lesions  Chaperone present: yes  A:  Well Woman with normal exam   S/P Bilateral mastectomy following DCIS on right 6/07 ER/ PR negative, treated with radiation. Later reconstruction bilaterally X 2  S/P TAH with abdominoplasty secondary to endo, fibroids, adhesions 1989 History of LSA - with current flare History of low back pain - better with chiropractic care             History of Vit D deficiency - PCP now follows   P:   Reviewed health and wellness pertinent to exam  Pap smear not indicated  Refill on Clobetasol to use BID for a week with current flare then prn  She will apply coconut oil to vulvar areas daily  Counseled on adequate intake of calcium and vitamin D, diet and exercise, Kegel's exercises return annually or prn  An After Visit Summary was printed and given to the patient.

## 2016-08-18 ENCOUNTER — Telehealth: Payer: Self-pay | Admitting: Nurse Practitioner

## 2016-08-18 NOTE — Telephone Encounter (Signed)
Spoke with patient. Patient states that she went to pick up her rx for Clobetasol and it is going to be $85. Patient would like an alternative recommendations from Kem Boroughs, FNP to reduce cost. Advised I will review with Kem Boroughs, FNP and return call. Patient is okay for RN to speak with Kem Boroughs, FNP upon return to the office tomorrow.

## 2016-08-18 NOTE — Telephone Encounter (Signed)
Patient would like to speak with nurse about a prescription. 

## 2016-08-19 ENCOUNTER — Other Ambulatory Visit: Payer: Self-pay | Admitting: Nurse Practitioner

## 2016-08-19 MED ORDER — CLOBETASOL PROPIONATE 0.05 % EX OINT: 1.0000 "application " | TOPICAL_OINTMENT | Freq: Two times a day (BID) | CUTANEOUS | 1 refills | Status: DC

## 2016-08-19 NOTE — Telephone Encounter (Signed)
New order for Temovate which may be cheaper.  To call the pharmacy and call back if expensive.

## 2016-08-19 NOTE — Telephone Encounter (Signed)
Patient returning call. Says she picked up the new prescription which was cheaper.

## 2016-08-19 NOTE — Telephone Encounter (Signed)
Rx for Clobetasol Prop Emollient Base 0.05% emollient cream discontinued as the patient filled rx for Temovate .05 % ointment.  Routing to provider for final review. Patient agreeable to disposition. Will close encounter.

## 2016-08-19 NOTE — Telephone Encounter (Signed)
Spoke with patient. Advised of message as seen below from Krista Carney, [place]. Patient will check with her pharmacy to see which prescription is more cost effective and return call.

## 2016-08-21 NOTE — Progress Notes (Signed)
Encounter reviewed by Dr. Brook Amundson C. Silva.  

## 2016-09-21 DIAGNOSIS — E784 Other hyperlipidemia: Secondary | ICD-10-CM | POA: Diagnosis not present

## 2016-09-21 DIAGNOSIS — R7301 Impaired fasting glucose: Secondary | ICD-10-CM | POA: Diagnosis not present

## 2016-09-21 DIAGNOSIS — Z0000 Encounter for general adult medical examination without abnormal findings: Secondary | ICD-10-CM | POA: Diagnosis not present

## 2016-09-28 DIAGNOSIS — R011 Cardiac murmur, unspecified: Secondary | ICD-10-CM | POA: Diagnosis not present

## 2016-09-28 DIAGNOSIS — D0590 Unspecified type of carcinoma in situ of unspecified breast: Secondary | ICD-10-CM | POA: Diagnosis not present

## 2016-09-28 DIAGNOSIS — H6123 Impacted cerumen, bilateral: Secondary | ICD-10-CM | POA: Diagnosis not present

## 2016-09-28 DIAGNOSIS — Z0000 Encounter for general adult medical examination without abnormal findings: Secondary | ICD-10-CM | POA: Diagnosis not present

## 2016-09-28 DIAGNOSIS — J0100 Acute maxillary sinusitis, unspecified: Secondary | ICD-10-CM | POA: Diagnosis not present

## 2016-09-28 DIAGNOSIS — N952 Postmenopausal atrophic vaginitis: Secondary | ICD-10-CM | POA: Diagnosis not present

## 2016-09-28 DIAGNOSIS — Z6824 Body mass index (BMI) 24.0-24.9, adult: Secondary | ICD-10-CM | POA: Diagnosis not present

## 2016-09-28 DIAGNOSIS — Z1389 Encounter for screening for other disorder: Secondary | ICD-10-CM | POA: Diagnosis not present

## 2016-09-28 DIAGNOSIS — M5136 Other intervertebral disc degeneration, lumbar region: Secondary | ICD-10-CM | POA: Diagnosis not present

## 2016-09-28 DIAGNOSIS — M79605 Pain in left leg: Secondary | ICD-10-CM | POA: Diagnosis not present

## 2016-09-28 DIAGNOSIS — E784 Other hyperlipidemia: Secondary | ICD-10-CM | POA: Diagnosis not present

## 2016-09-28 DIAGNOSIS — J189 Pneumonia, unspecified organism: Secondary | ICD-10-CM | POA: Diagnosis not present

## 2016-09-30 DIAGNOSIS — Z1212 Encounter for screening for malignant neoplasm of rectum: Secondary | ICD-10-CM | POA: Diagnosis not present

## 2016-10-21 DIAGNOSIS — Z780 Asymptomatic menopausal state: Secondary | ICD-10-CM | POA: Diagnosis not present

## 2016-11-03 ENCOUNTER — Other Ambulatory Visit: Payer: Self-pay | Admitting: Physician Assistant

## 2016-11-03 DIAGNOSIS — R1011 Right upper quadrant pain: Secondary | ICD-10-CM | POA: Diagnosis not present

## 2016-11-03 DIAGNOSIS — R198 Other specified symptoms and signs involving the digestive system and abdomen: Secondary | ICD-10-CM | POA: Diagnosis not present

## 2016-11-03 DIAGNOSIS — K582 Mixed irritable bowel syndrome: Secondary | ICD-10-CM | POA: Diagnosis not present

## 2016-11-03 DIAGNOSIS — R1030 Lower abdominal pain, unspecified: Secondary | ICD-10-CM | POA: Diagnosis not present

## 2016-11-03 DIAGNOSIS — K219 Gastro-esophageal reflux disease without esophagitis: Secondary | ICD-10-CM | POA: Diagnosis not present

## 2016-11-11 ENCOUNTER — Ambulatory Visit
Admission: RE | Admit: 2016-11-11 | Discharge: 2016-11-11 | Disposition: A | Discharge status: Home / Self Care | Payer: PPO | Source: Ambulatory Visit | Attending: Physician Assistant | Admitting: Physician Assistant

## 2016-11-11 DIAGNOSIS — R1011 Right upper quadrant pain: Secondary | ICD-10-CM

## 2016-11-15 ENCOUNTER — Other Ambulatory Visit: Payer: Self-pay | Admitting: Gastroenterology

## 2016-11-15 DIAGNOSIS — K7689 Other specified diseases of liver: Secondary | ICD-10-CM

## 2016-11-15 DIAGNOSIS — R9389 Abnormal findings on diagnostic imaging of other specified body structures: Secondary | ICD-10-CM

## 2016-11-16 DIAGNOSIS — R1011 Right upper quadrant pain: Secondary | ICD-10-CM | POA: Diagnosis not present

## 2016-11-23 DIAGNOSIS — H25013 Cortical age-related cataract, bilateral: Secondary | ICD-10-CM | POA: Diagnosis not present

## 2016-11-24 ENCOUNTER — Ambulatory Visit
Admission: RE | Admit: 2016-11-24 | Discharge: 2016-11-24 | Disposition: A | Discharge status: Home / Self Care | Payer: PPO | Source: Ambulatory Visit | Attending: Gastroenterology | Admitting: Gastroenterology

## 2016-11-24 DIAGNOSIS — R9389 Abnormal findings on diagnostic imaging of other specified body structures: Secondary | ICD-10-CM

## 2016-11-24 DIAGNOSIS — K7689 Other specified diseases of liver: Secondary | ICD-10-CM | POA: Diagnosis not present

## 2016-11-24 MED ORDER — GADOBENATE DIMEGLUMINE 529 MG/ML IV SOLN
13.0000 mL | Freq: Once | INTRAVENOUS | Status: AC | PRN
  Administered 2016-11-24: 13 mL via INTRAVENOUS

## 2016-11-29 ENCOUNTER — Other Ambulatory Visit: Payer: PPO

## 2016-11-30 ENCOUNTER — Other Ambulatory Visit: Payer: PPO

## 2016-12-02 DIAGNOSIS — K219 Gastro-esophageal reflux disease without esophagitis: Secondary | ICD-10-CM | POA: Diagnosis not present

## 2016-12-02 DIAGNOSIS — K582 Mixed irritable bowel syndrome: Secondary | ICD-10-CM | POA: Diagnosis not present

## 2016-12-02 DIAGNOSIS — R198 Other specified symptoms and signs involving the digestive system and abdomen: Secondary | ICD-10-CM | POA: Diagnosis not present

## 2016-12-02 DIAGNOSIS — R1011 Right upper quadrant pain: Secondary | ICD-10-CM | POA: Diagnosis not present

## 2016-12-08 DIAGNOSIS — R1011 Right upper quadrant pain: Secondary | ICD-10-CM | POA: Diagnosis not present

## 2016-12-22 DIAGNOSIS — K2930 Chronic superficial gastritis without bleeding: Secondary | ICD-10-CM | POA: Diagnosis not present

## 2016-12-22 DIAGNOSIS — B9681 Helicobacter pylori [H. pylori] as the cause of diseases classified elsewhere: Secondary | ICD-10-CM | POA: Diagnosis not present

## 2016-12-22 DIAGNOSIS — K2950 Unspecified chronic gastritis without bleeding: Secondary | ICD-10-CM | POA: Diagnosis not present

## 2016-12-22 DIAGNOSIS — R1013 Epigastric pain: Secondary | ICD-10-CM | POA: Diagnosis not present

## 2016-12-28 DIAGNOSIS — B9681 Helicobacter pylori [H. pylori] as the cause of diseases classified elsewhere: Secondary | ICD-10-CM | POA: Diagnosis not present

## 2016-12-28 DIAGNOSIS — K2950 Unspecified chronic gastritis without bleeding: Secondary | ICD-10-CM | POA: Diagnosis not present

## 2016-12-28 DIAGNOSIS — K2930 Chronic superficial gastritis without bleeding: Secondary | ICD-10-CM | POA: Diagnosis not present

## 2017-01-03 DIAGNOSIS — L821 Other seborrheic keratosis: Secondary | ICD-10-CM | POA: Diagnosis not present

## 2017-01-03 DIAGNOSIS — X32XXXA Exposure to sunlight, initial encounter: Secondary | ICD-10-CM | POA: Diagnosis not present

## 2017-01-03 DIAGNOSIS — Z85828 Personal history of other malignant neoplasm of skin: Secondary | ICD-10-CM | POA: Diagnosis not present

## 2017-01-03 DIAGNOSIS — L578 Other skin changes due to chronic exposure to nonionizing radiation: Secondary | ICD-10-CM | POA: Diagnosis not present

## 2017-01-03 DIAGNOSIS — L249 Irritant contact dermatitis, unspecified cause: Secondary | ICD-10-CM | POA: Diagnosis not present

## 2017-01-03 DIAGNOSIS — D2271 Melanocytic nevi of right lower limb, including hip: Secondary | ICD-10-CM | POA: Diagnosis not present

## 2017-01-03 DIAGNOSIS — L0889 Other specified local infections of the skin and subcutaneous tissue: Secondary | ICD-10-CM | POA: Diagnosis not present

## 2017-01-03 DIAGNOSIS — D2261 Melanocytic nevi of right upper limb, including shoulder: Secondary | ICD-10-CM | POA: Diagnosis not present

## 2017-01-03 DIAGNOSIS — L570 Actinic keratosis: Secondary | ICD-10-CM | POA: Diagnosis not present

## 2017-01-03 DIAGNOSIS — L814 Other melanin hyperpigmentation: Secondary | ICD-10-CM | POA: Diagnosis not present

## 2017-01-03 DIAGNOSIS — D2272 Melanocytic nevi of left lower limb, including hip: Secondary | ICD-10-CM | POA: Diagnosis not present

## 2017-01-03 DIAGNOSIS — B078 Other viral warts: Secondary | ICD-10-CM | POA: Diagnosis not present

## 2017-01-03 DIAGNOSIS — Z08 Encounter for follow-up examination after completed treatment for malignant neoplasm: Secondary | ICD-10-CM | POA: Diagnosis not present

## 2017-01-03 DIAGNOSIS — D225 Melanocytic nevi of trunk: Secondary | ICD-10-CM | POA: Diagnosis not present

## 2017-01-03 DIAGNOSIS — D2262 Melanocytic nevi of left upper limb, including shoulder: Secondary | ICD-10-CM | POA: Diagnosis not present

## 2017-01-03 DIAGNOSIS — L538 Other specified erythematous conditions: Secondary | ICD-10-CM | POA: Diagnosis not present

## 2017-02-02 DIAGNOSIS — K582 Mixed irritable bowel syndrome: Secondary | ICD-10-CM | POA: Diagnosis not present

## 2017-02-02 DIAGNOSIS — K219 Gastro-esophageal reflux disease without esophagitis: Secondary | ICD-10-CM | POA: Diagnosis not present

## 2017-02-02 DIAGNOSIS — A048 Other specified bacterial intestinal infections: Secondary | ICD-10-CM | POA: Diagnosis not present

## 2017-04-25 DIAGNOSIS — A048 Other specified bacterial intestinal infections: Secondary | ICD-10-CM | POA: Diagnosis not present

## 2017-04-25 DIAGNOSIS — K582 Mixed irritable bowel syndrome: Secondary | ICD-10-CM | POA: Diagnosis not present

## 2017-04-25 DIAGNOSIS — R140 Abdominal distension (gaseous): Secondary | ICD-10-CM | POA: Diagnosis not present

## 2017-04-25 DIAGNOSIS — R1011 Right upper quadrant pain: Secondary | ICD-10-CM | POA: Diagnosis not present

## 2017-05-19 DIAGNOSIS — K582 Mixed irritable bowel syndrome: Secondary | ICD-10-CM | POA: Diagnosis not present

## 2017-05-19 DIAGNOSIS — R1084 Generalized abdominal pain: Secondary | ICD-10-CM | POA: Diagnosis not present

## 2017-06-16 DIAGNOSIS — H43812 Vitreous degeneration, left eye: Secondary | ICD-10-CM | POA: Diagnosis not present

## 2017-07-04 DIAGNOSIS — H43812 Vitreous degeneration, left eye: Secondary | ICD-10-CM | POA: Diagnosis not present

## 2017-07-10 DIAGNOSIS — D2272 Melanocytic nevi of left lower limb, including hip: Secondary | ICD-10-CM | POA: Diagnosis not present

## 2017-07-10 DIAGNOSIS — D225 Melanocytic nevi of trunk: Secondary | ICD-10-CM | POA: Diagnosis not present

## 2017-07-10 DIAGNOSIS — Z85828 Personal history of other malignant neoplasm of skin: Secondary | ICD-10-CM | POA: Diagnosis not present

## 2017-07-10 DIAGNOSIS — D2271 Melanocytic nevi of right lower limb, including hip: Secondary | ICD-10-CM | POA: Diagnosis not present

## 2017-07-10 DIAGNOSIS — L814 Other melanin hyperpigmentation: Secondary | ICD-10-CM | POA: Diagnosis not present

## 2017-07-10 DIAGNOSIS — Z08 Encounter for follow-up examination after completed treatment for malignant neoplasm: Secondary | ICD-10-CM | POA: Diagnosis not present

## 2017-07-10 DIAGNOSIS — C50912 Malignant neoplasm of unspecified site of left female breast: Secondary | ICD-10-CM | POA: Diagnosis not present

## 2017-07-10 DIAGNOSIS — D2261 Melanocytic nevi of right upper limb, including shoulder: Secondary | ICD-10-CM | POA: Diagnosis not present

## 2017-07-10 DIAGNOSIS — L738 Other specified follicular disorders: Secondary | ICD-10-CM | POA: Diagnosis not present

## 2017-07-10 DIAGNOSIS — D2262 Melanocytic nevi of left upper limb, including shoulder: Secondary | ICD-10-CM | POA: Diagnosis not present

## 2017-07-20 DIAGNOSIS — R1084 Generalized abdominal pain: Secondary | ICD-10-CM | POA: Diagnosis not present

## 2017-07-20 DIAGNOSIS — K219 Gastro-esophageal reflux disease without esophagitis: Secondary | ICD-10-CM | POA: Diagnosis not present

## 2017-07-20 DIAGNOSIS — K582 Mixed irritable bowel syndrome: Secondary | ICD-10-CM | POA: Diagnosis not present

## 2017-08-21 ENCOUNTER — Ambulatory Visit: Payer: PPO | Admitting: Nurse Practitioner

## 2017-09-26 DIAGNOSIS — R82998 Other abnormal findings in urine: Secondary | ICD-10-CM | POA: Diagnosis not present

## 2017-09-26 DIAGNOSIS — E7849 Other hyperlipidemia: Secondary | ICD-10-CM | POA: Diagnosis not present

## 2017-09-26 DIAGNOSIS — R7301 Impaired fasting glucose: Secondary | ICD-10-CM | POA: Diagnosis not present

## 2017-10-05 DIAGNOSIS — Z1212 Encounter for screening for malignant neoplasm of rectum: Secondary | ICD-10-CM | POA: Diagnosis not present

## 2017-10-11 DIAGNOSIS — J0100 Acute maxillary sinusitis, unspecified: Secondary | ICD-10-CM | POA: Diagnosis not present

## 2017-10-11 DIAGNOSIS — M79605 Pain in left leg: Secondary | ICD-10-CM | POA: Diagnosis not present

## 2017-10-11 DIAGNOSIS — B0229 Other postherpetic nervous system involvement: Secondary | ICD-10-CM | POA: Diagnosis not present

## 2017-10-11 DIAGNOSIS — Z6824 Body mass index (BMI) 24.0-24.9, adult: Secondary | ICD-10-CM | POA: Diagnosis not present

## 2017-10-11 DIAGNOSIS — R1013 Epigastric pain: Secondary | ICD-10-CM | POA: Diagnosis not present

## 2017-10-11 DIAGNOSIS — R002 Palpitations: Secondary | ICD-10-CM | POA: Diagnosis not present

## 2017-10-11 DIAGNOSIS — R011 Cardiac murmur, unspecified: Secondary | ICD-10-CM | POA: Diagnosis not present

## 2017-10-11 DIAGNOSIS — H6123 Impacted cerumen, bilateral: Secondary | ICD-10-CM | POA: Diagnosis not present

## 2017-10-11 DIAGNOSIS — E7849 Other hyperlipidemia: Secondary | ICD-10-CM | POA: Diagnosis not present

## 2017-10-11 DIAGNOSIS — Z1389 Encounter for screening for other disorder: Secondary | ICD-10-CM | POA: Diagnosis not present

## 2017-10-11 DIAGNOSIS — Z0000 Encounter for general adult medical examination without abnormal findings: Secondary | ICD-10-CM | POA: Diagnosis not present

## 2017-10-11 DIAGNOSIS — K219 Gastro-esophageal reflux disease without esophagitis: Secondary | ICD-10-CM | POA: Diagnosis not present

## 2017-10-30 DIAGNOSIS — H2513 Age-related nuclear cataract, bilateral: Secondary | ICD-10-CM | POA: Diagnosis not present

## 2017-11-08 DIAGNOSIS — M722 Plantar fascial fibromatosis: Secondary | ICD-10-CM | POA: Diagnosis not present

## 2017-12-05 DIAGNOSIS — K591 Functional diarrhea: Secondary | ICD-10-CM | POA: Diagnosis not present

## 2017-12-05 DIAGNOSIS — K219 Gastro-esophageal reflux disease without esophagitis: Secondary | ICD-10-CM | POA: Diagnosis not present

## 2017-12-14 DIAGNOSIS — Z08 Encounter for follow-up examination after completed treatment for malignant neoplasm: Secondary | ICD-10-CM | POA: Diagnosis not present

## 2017-12-14 DIAGNOSIS — D2272 Melanocytic nevi of left lower limb, including hip: Secondary | ICD-10-CM | POA: Diagnosis not present

## 2017-12-14 DIAGNOSIS — L814 Other melanin hyperpigmentation: Secondary | ICD-10-CM | POA: Diagnosis not present

## 2017-12-14 DIAGNOSIS — L570 Actinic keratosis: Secondary | ICD-10-CM | POA: Diagnosis not present

## 2017-12-14 DIAGNOSIS — Z85828 Personal history of other malignant neoplasm of skin: Secondary | ICD-10-CM | POA: Diagnosis not present

## 2017-12-14 DIAGNOSIS — D485 Neoplasm of uncertain behavior of skin: Secondary | ICD-10-CM | POA: Diagnosis not present

## 2017-12-14 DIAGNOSIS — C50912 Malignant neoplasm of unspecified site of left female breast: Secondary | ICD-10-CM | POA: Diagnosis not present

## 2017-12-14 DIAGNOSIS — X32XXXA Exposure to sunlight, initial encounter: Secondary | ICD-10-CM | POA: Diagnosis not present

## 2017-12-14 DIAGNOSIS — D2271 Melanocytic nevi of right lower limb, including hip: Secondary | ICD-10-CM | POA: Diagnosis not present

## 2017-12-14 DIAGNOSIS — L821 Other seborrheic keratosis: Secondary | ICD-10-CM | POA: Diagnosis not present

## 2017-12-14 DIAGNOSIS — D225 Melanocytic nevi of trunk: Secondary | ICD-10-CM | POA: Diagnosis not present

## 2017-12-14 DIAGNOSIS — D2261 Melanocytic nevi of right upper limb, including shoulder: Secondary | ICD-10-CM | POA: Diagnosis not present

## 2018-01-13 IMAGING — MR MR ABDOMEN WO/W CM
17 series · 48 of 48 positions shown · IV contrast (13ml multihance)
Comparison: 11/11/2016 abdominal sonogram.

CLINICAL DATA: Complex liver cysts and indeterminate material
within the fundal gallbladder on recent sonogram performed for
abdominal pain.

EXAM:
MRI ABDOMEN WITHOUT AND WITH CONTRAST
TECHNIQUE: Multiplanar multisequence MR imaging of the abdomen was performed
both before and after the administration of intravenous contrast.
CONTRAST:  13mL MULTIHANCE GADOBENATE DIMEGLUMINE 529 MG/ML IV SOLN

[Series 3: T2 · coronal · 5.0mm · 1.56mm/px · 1 of 36 slices shown (1 of 3)]
[im 1/36]
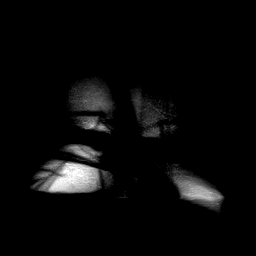

[Series 4: T1 · axial · 3.0mm · 1.19mm/px · z∈[-143,+70]mm · 5 of 144 slices shown]
[im 1/144]
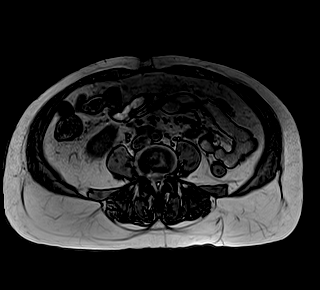
[im 36/144]
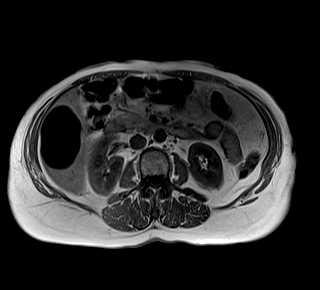
[im 72/144]
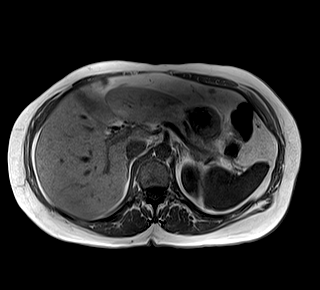
[im 108/144]
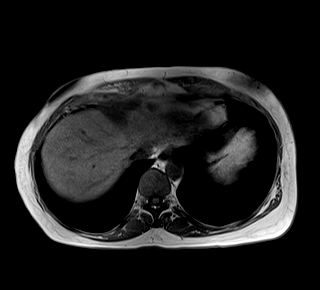
[im 144/144]
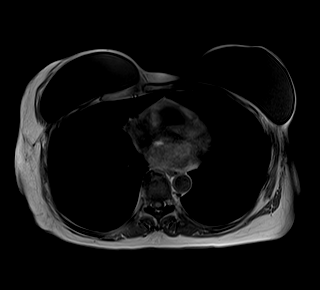

[Series 5: T2 · axial · 5.0mm · 1.48mm/px · z∈[-125,+121]mm · 2 of 42 slices shown (2 of 3)]
[im 1/42]
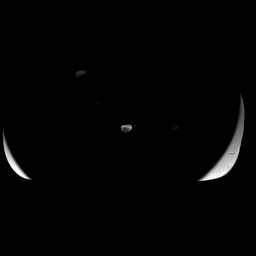
[im 42/42]
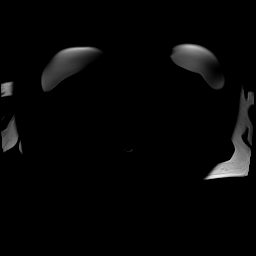

[Series 6: DWI · axial · 5.0mm · 1.42mm/px · z∈[-125,+121]mm · 5 of 126 slices shown (1 of 2)]
[im 1/126]
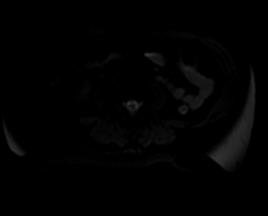
[im 32/126]
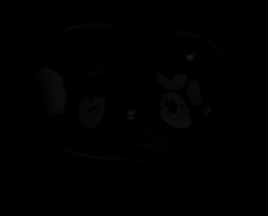
[im 63/126]
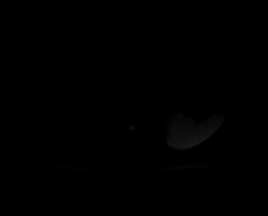
[im 94/126]
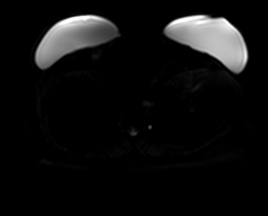
[im 126/126]
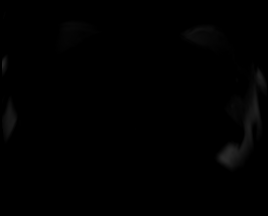

[Series 7: DWI · axial · 5.0mm · 1.42mm/px · z∈[-125,+121]mm · 2 of 42 slices shown (2 of 2)]
[im 1/42]
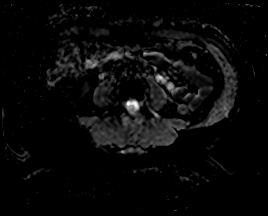
[im 42/42]
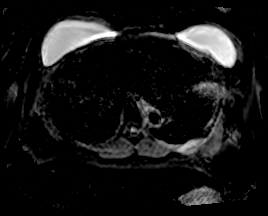

[Series 8: T2 · axial · 6.0mm · 1.19mm/px · 1 of 30 slices shown (3 of 3)]
[im 1/30]
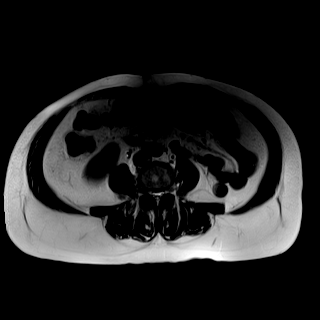

[Series 9: bSSFP · axial · 5.0mm · 1.25mm/px · z∈[-147,+75]mm · 2 of 38 slices shown]
[im 1/38]
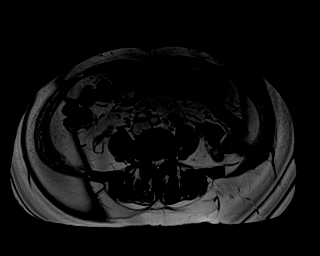
[im 38/38]
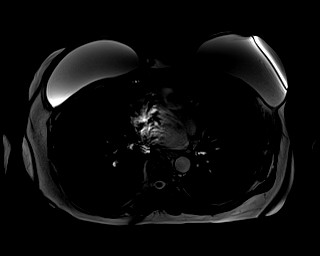

[Series 10: T1 dynamic · axial · non-contrast · 3.0mm · 1.25mm/px · z∈[-143,+70]mm · 3 of 72 slices shown]
[im 1/72]
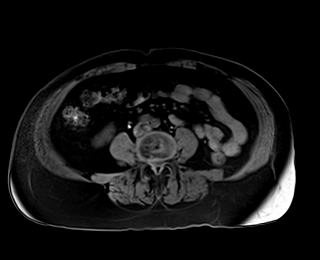
[im 36/72]
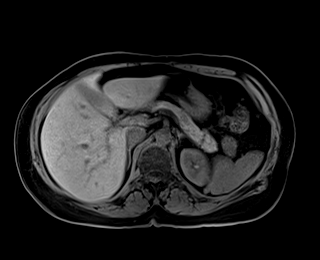
[im 72/72]
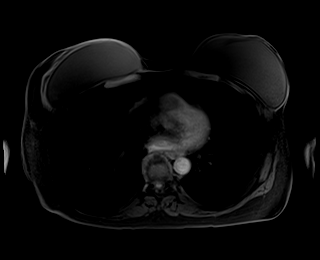

[Series 11: T1 dynamic post-contrast · axial · 3.0mm · 1.25mm/px · z∈[-143,+70]mm · 3 of 72 slices shown (1 of 9)]
[im 1/72]
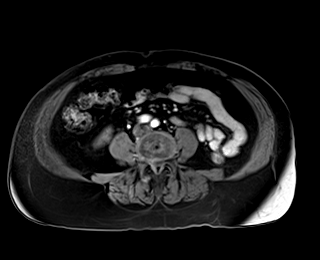
[im 36/72]
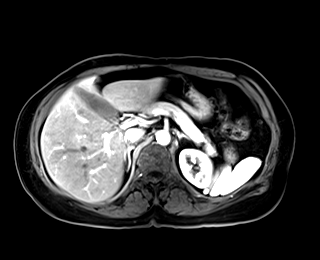
[im 72/72]
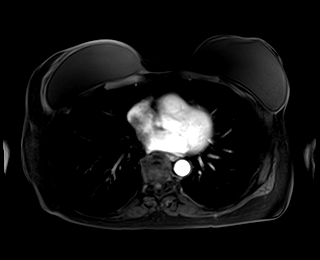

[Series 12: T1 dynamic post-contrast · axial · 3.0mm · 1.25mm/px · z∈[-143,+70]mm · 3 of 72 slices shown (2 of 9)]
[im 1/72]
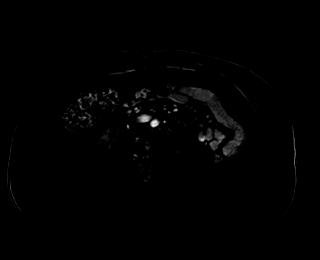
[im 36/72]
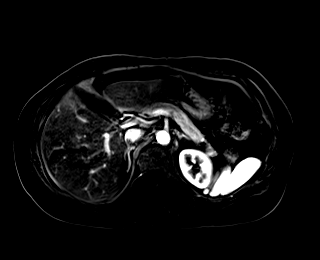
[im 72/72]
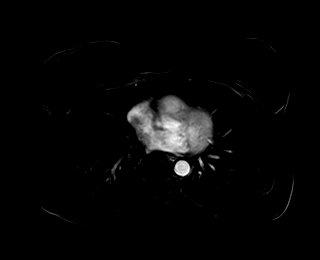

[Series 13: T1 dynamic post-contrast · axial · 3.0mm · 1.25mm/px · z∈[-143,+70]mm · 3 of 72 slices shown (3 of 9)]
[im 1/72]
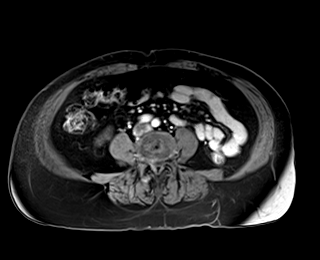
[im 36/72]
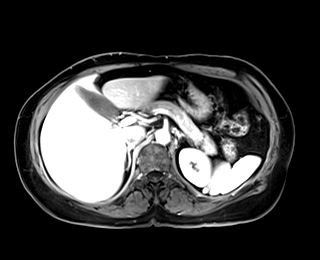
[im 72/72]
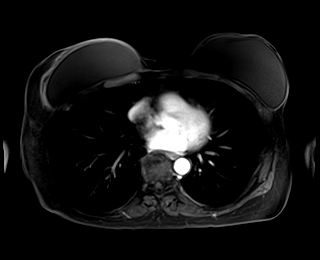

[Series 14: T1 dynamic post-contrast · axial · 3.0mm · 1.25mm/px · z∈[-143,+70]mm · 3 of 72 slices shown (4 of 9)]
[im 1/72]
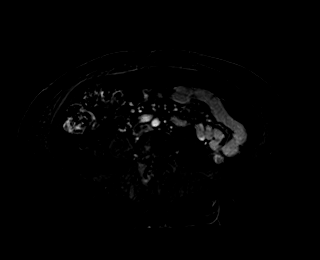
[im 36/72]
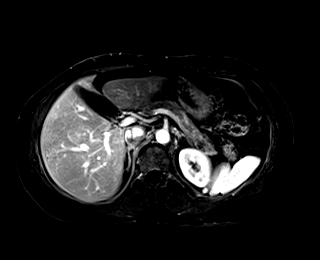
[im 72/72]
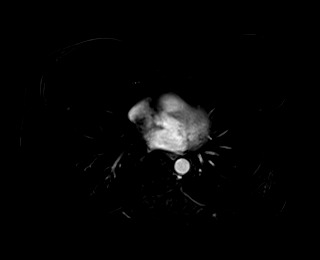

[Series 15: T1 dynamic post-contrast · axial · 3.0mm · 1.25mm/px · z∈[-143,+70]mm · 3 of 72 slices shown (5 of 9)]
[im 1/72]
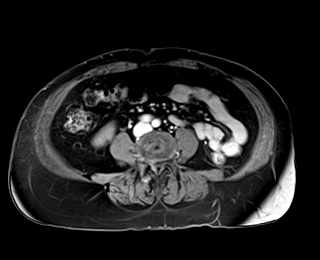
[im 36/72]
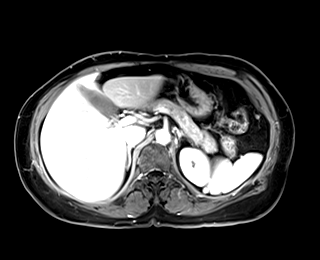
[im 72/72]
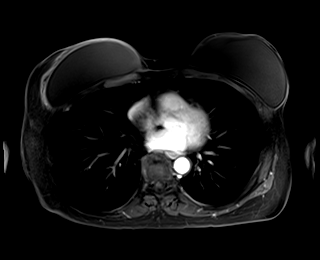

[Series 16: T1 dynamic post-contrast · axial · 3.0mm · 1.25mm/px · z∈[-143,+70]mm · 3 of 72 slices shown (6 of 9)]
[im 1/72]
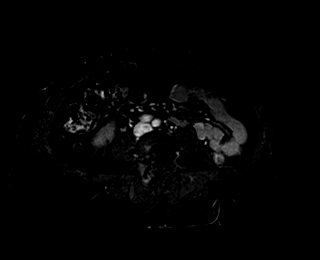
[im 36/72]
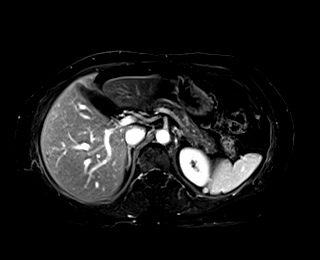
[im 72/72]
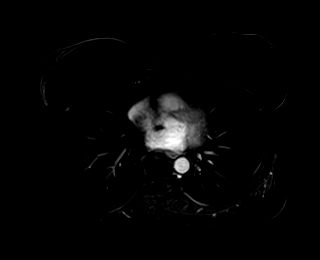

[Series 17: T1 dynamic post-contrast · coronal · 3.0mm · 1.25mm/px · 3 of 72 slices shown (7 of 9)]
[im 1/72]
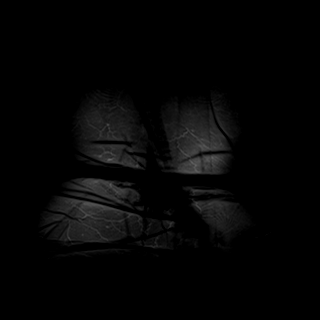
[im 36/72]
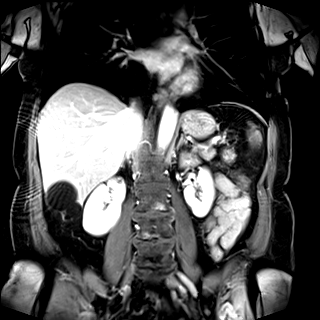
[im 72/72]
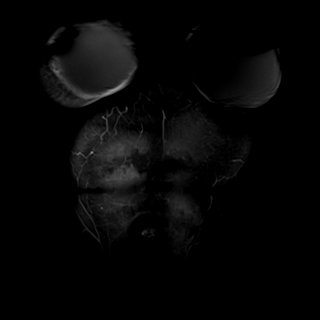

[Series 18: T1 dynamic post-contrast · axial · 3.0mm · 1.25mm/px · z∈[-143,+70]mm · 3 of 72 slices shown (8 of 9)]
[im 1/72]
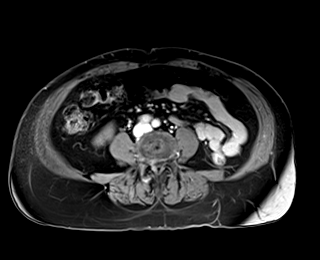
[im 36/72]
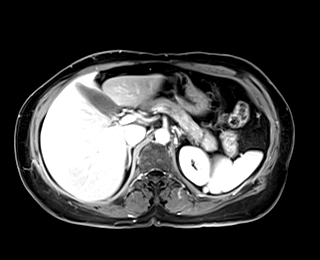
[im 72/72]
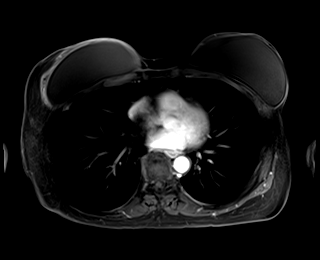

[Series 19: T1 dynamic post-contrast · axial · 3.0mm · 1.25mm/px · z∈[-143,+70]mm · 3 of 72 slices shown (9 of 9)]
[im 1/72]
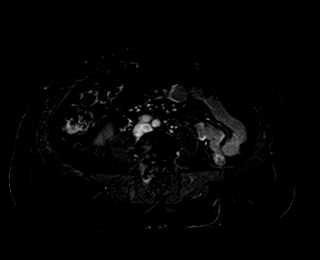
[im 36/72]
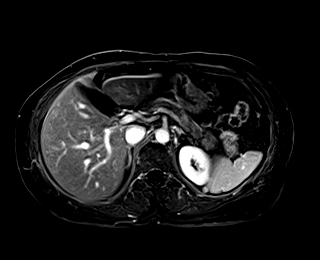
[im 72/72]
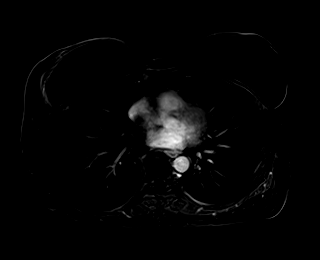

[48 of 48 positions shown; findings below may reference images not displayed]

FINDINGS: Lower chest: Clear lung bases.

Hepatobiliary: Normal liver size and configuration. Mild diffuse
hepatic steatosis. Minimally complex lobulated 8.2 x 4.3 cm far
inferior right liver lobe cyst with thin internal septations and no
solid enhancement. Similar minimally complex lobulated 3.6 x 1.8 cm
segment 2 left liver lobe cyst with thin internal septations and no
solid enhancement. Additional smaller simple cysts scattered in the
liver. No suspicious liver masses. Normal gallbladder with Phrygian
cap. No gallstones, gallbladder masses, gallbladder wall thickening
or pericholecystic fluid. No biliary ductal dilatation. Common bile
duct diameter 4 mm. No choledocholithiasis.

Pancreas: No pancreatic mass or duct dilation. Probable pancreas
divisum.

Spleen: Normal size. No mass.

Adrenals/Urinary Tract: Normal adrenals. No hydronephrosis. Normal
kidneys with no renal mass.

Stomach/Bowel: Grossly normal stomach. Visualized small and large
bowel is normal caliber, with no bowel wall thickening.

Vascular/Lymphatic: Normal caliber abdominal aorta. Patent portal,
splenic, hepatic and renal veins. No pathologically enlarged lymph
nodes in the abdomen.

Other: No abdominal ascites or focal fluid collection. Intact
appearing partially visualized bilateral breast prostheses.

Musculoskeletal: No aggressive appearing focal osseous lesions.
IMPRESSION: 1. Benign-appearing liver cysts, a few of which are minimally
complex, largest 8.2 cm in the far inferior right liver lobe. No
suspicious liver masses.
2. Mild diffuse hepatic steatosis.
3. Normal gallbladder with Phrygian cap. No cholelithiasis. No
gallbladder mass. No biliary ductal dilatation.
4. Probable pancreas divisum.

## 2018-02-15 DIAGNOSIS — K591 Functional diarrhea: Secondary | ICD-10-CM | POA: Diagnosis not present

## 2018-02-15 DIAGNOSIS — K582 Mixed irritable bowel syndrome: Secondary | ICD-10-CM | POA: Diagnosis not present

## 2018-02-28 DIAGNOSIS — K591 Functional diarrhea: Secondary | ICD-10-CM | POA: Diagnosis not present

## 2018-03-29 DIAGNOSIS — K591 Functional diarrhea: Secondary | ICD-10-CM | POA: Diagnosis not present

## 2018-05-03 DIAGNOSIS — R1031 Right lower quadrant pain: Secondary | ICD-10-CM | POA: Diagnosis not present

## 2018-05-03 DIAGNOSIS — K591 Functional diarrhea: Secondary | ICD-10-CM | POA: Diagnosis not present

## 2018-05-03 DIAGNOSIS — K219 Gastro-esophageal reflux disease without esophagitis: Secondary | ICD-10-CM | POA: Diagnosis not present

## 2018-05-03 DIAGNOSIS — K625 Hemorrhage of anus and rectum: Secondary | ICD-10-CM | POA: Diagnosis not present

## 2018-05-03 DIAGNOSIS — R195 Other fecal abnormalities: Secondary | ICD-10-CM | POA: Diagnosis not present

## 2018-06-15 DIAGNOSIS — R1031 Right lower quadrant pain: Secondary | ICD-10-CM | POA: Diagnosis not present

## 2018-06-15 DIAGNOSIS — R195 Other fecal abnormalities: Secondary | ICD-10-CM | POA: Diagnosis not present

## 2018-06-15 DIAGNOSIS — K219 Gastro-esophageal reflux disease without esophagitis: Secondary | ICD-10-CM | POA: Diagnosis not present

## 2018-06-15 DIAGNOSIS — K5730 Diverticulosis of large intestine without perforation or abscess without bleeding: Secondary | ICD-10-CM | POA: Diagnosis not present

## 2018-06-15 DIAGNOSIS — D125 Benign neoplasm of sigmoid colon: Secondary | ICD-10-CM | POA: Diagnosis not present

## 2018-06-19 DIAGNOSIS — D125 Benign neoplasm of sigmoid colon: Secondary | ICD-10-CM | POA: Diagnosis not present

## 2018-06-21 DIAGNOSIS — C50912 Malignant neoplasm of unspecified site of left female breast: Secondary | ICD-10-CM | POA: Diagnosis not present

## 2018-06-21 DIAGNOSIS — D225 Melanocytic nevi of trunk: Secondary | ICD-10-CM | POA: Diagnosis not present

## 2018-06-21 DIAGNOSIS — L578 Other skin changes due to chronic exposure to nonionizing radiation: Secondary | ICD-10-CM | POA: Diagnosis not present

## 2018-06-21 DIAGNOSIS — X32XXXA Exposure to sunlight, initial encounter: Secondary | ICD-10-CM | POA: Diagnosis not present

## 2018-06-21 DIAGNOSIS — Z85828 Personal history of other malignant neoplasm of skin: Secondary | ICD-10-CM | POA: Diagnosis not present

## 2018-06-21 DIAGNOSIS — D485 Neoplasm of uncertain behavior of skin: Secondary | ICD-10-CM | POA: Diagnosis not present

## 2018-06-21 DIAGNOSIS — L821 Other seborrheic keratosis: Secondary | ICD-10-CM | POA: Diagnosis not present

## 2018-06-21 DIAGNOSIS — L570 Actinic keratosis: Secondary | ICD-10-CM | POA: Diagnosis not present

## 2018-06-21 DIAGNOSIS — Z08 Encounter for follow-up examination after completed treatment for malignant neoplasm: Secondary | ICD-10-CM | POA: Diagnosis not present

## 2018-06-21 DIAGNOSIS — L298 Other pruritus: Secondary | ICD-10-CM | POA: Diagnosis not present

## 2018-06-21 DIAGNOSIS — L820 Inflamed seborrheic keratosis: Secondary | ICD-10-CM | POA: Diagnosis not present

## 2018-09-19 DIAGNOSIS — R1084 Generalized abdominal pain: Secondary | ICD-10-CM | POA: Diagnosis not present

## 2018-09-19 DIAGNOSIS — K582 Mixed irritable bowel syndrome: Secondary | ICD-10-CM | POA: Diagnosis not present

## 2018-09-19 DIAGNOSIS — R195 Other fecal abnormalities: Secondary | ICD-10-CM | POA: Diagnosis not present

## 2018-09-19 DIAGNOSIS — K625 Hemorrhage of anus and rectum: Secondary | ICD-10-CM | POA: Diagnosis not present

## 2018-09-19 DIAGNOSIS — K591 Functional diarrhea: Secondary | ICD-10-CM | POA: Diagnosis not present

## 2018-09-19 DIAGNOSIS — A048 Other specified bacterial intestinal infections: Secondary | ICD-10-CM | POA: Diagnosis not present

## 2018-10-02 DIAGNOSIS — C44722 Squamous cell carcinoma of skin of right lower limb, including hip: Secondary | ICD-10-CM | POA: Diagnosis not present

## 2018-10-02 DIAGNOSIS — L821 Other seborrheic keratosis: Secondary | ICD-10-CM | POA: Diagnosis not present

## 2018-10-02 DIAGNOSIS — D485 Neoplasm of uncertain behavior of skin: Secondary | ICD-10-CM | POA: Diagnosis not present

## 2018-10-22 DIAGNOSIS — C44722 Squamous cell carcinoma of skin of right lower limb, including hip: Secondary | ICD-10-CM | POA: Diagnosis not present

## 2018-11-02 DIAGNOSIS — R7301 Impaired fasting glucose: Secondary | ICD-10-CM | POA: Diagnosis not present

## 2018-11-02 DIAGNOSIS — E7849 Other hyperlipidemia: Secondary | ICD-10-CM | POA: Diagnosis not present

## 2018-11-05 DIAGNOSIS — R82998 Other abnormal findings in urine: Secondary | ICD-10-CM | POA: Diagnosis not present

## 2018-11-08 DIAGNOSIS — N952 Postmenopausal atrophic vaginitis: Secondary | ICD-10-CM | POA: Diagnosis not present

## 2018-11-08 DIAGNOSIS — E785 Hyperlipidemia, unspecified: Secondary | ICD-10-CM | POA: Diagnosis not present

## 2018-11-08 DIAGNOSIS — H6123 Impacted cerumen, bilateral: Secondary | ICD-10-CM | POA: Diagnosis not present

## 2018-11-08 DIAGNOSIS — R011 Cardiac murmur, unspecified: Secondary | ICD-10-CM | POA: Diagnosis not present

## 2018-11-08 DIAGNOSIS — M5136 Other intervertebral disc degeneration, lumbar region: Secondary | ICD-10-CM | POA: Diagnosis not present

## 2018-11-08 DIAGNOSIS — D0590 Unspecified type of carcinoma in situ of unspecified breast: Secondary | ICD-10-CM | POA: Diagnosis not present

## 2018-11-08 DIAGNOSIS — B0229 Other postherpetic nervous system involvement: Secondary | ICD-10-CM | POA: Diagnosis not present

## 2018-11-08 DIAGNOSIS — Z0000 Encounter for general adult medical examination without abnormal findings: Secondary | ICD-10-CM | POA: Diagnosis not present

## 2018-11-08 DIAGNOSIS — J302 Other seasonal allergic rhinitis: Secondary | ICD-10-CM | POA: Diagnosis not present

## 2018-11-08 DIAGNOSIS — Z1331 Encounter for screening for depression: Secondary | ICD-10-CM | POA: Diagnosis not present

## 2018-11-08 DIAGNOSIS — R51 Headache: Secondary | ICD-10-CM | POA: Diagnosis not present

## 2018-11-13 ENCOUNTER — Other Ambulatory Visit: Payer: Self-pay | Admitting: Internal Medicine

## 2018-11-13 DIAGNOSIS — E785 Hyperlipidemia, unspecified: Secondary | ICD-10-CM

## 2018-12-10 DIAGNOSIS — H2513 Age-related nuclear cataract, bilateral: Secondary | ICD-10-CM | POA: Diagnosis not present

## 2019-03-26 ENCOUNTER — Ambulatory Visit
Admission: RE | Admit: 2019-03-26 | Discharge: 2019-03-26 | Disposition: A | Discharge status: Home / Self Care | Payer: PPO | Source: Ambulatory Visit | Attending: Internal Medicine | Admitting: Internal Medicine

## 2019-03-26 DIAGNOSIS — E785 Hyperlipidemia, unspecified: Secondary | ICD-10-CM | POA: Diagnosis not present

## 2019-05-13 DIAGNOSIS — Z85828 Personal history of other malignant neoplasm of skin: Secondary | ICD-10-CM | POA: Diagnosis not present

## 2019-05-13 DIAGNOSIS — L570 Actinic keratosis: Secondary | ICD-10-CM | POA: Diagnosis not present

## 2019-05-13 DIAGNOSIS — D225 Melanocytic nevi of trunk: Secondary | ICD-10-CM | POA: Diagnosis not present

## 2019-05-13 DIAGNOSIS — L821 Other seborrheic keratosis: Secondary | ICD-10-CM | POA: Diagnosis not present

## 2019-05-13 DIAGNOSIS — C50912 Malignant neoplasm of unspecified site of left female breast: Secondary | ICD-10-CM | POA: Diagnosis not present

## 2019-05-13 DIAGNOSIS — L578 Other skin changes due to chronic exposure to nonionizing radiation: Secondary | ICD-10-CM | POA: Diagnosis not present

## 2019-05-13 DIAGNOSIS — Z08 Encounter for follow-up examination after completed treatment for malignant neoplasm: Secondary | ICD-10-CM | POA: Diagnosis not present

## 2019-05-13 DIAGNOSIS — X32XXXA Exposure to sunlight, initial encounter: Secondary | ICD-10-CM | POA: Diagnosis not present

## 2019-08-20 DIAGNOSIS — D485 Neoplasm of uncertain behavior of skin: Secondary | ICD-10-CM | POA: Diagnosis not present

## 2019-11-01 DIAGNOSIS — M79646 Pain in unspecified finger(s): Secondary | ICD-10-CM | POA: Insufficient documentation

## 2019-11-01 DIAGNOSIS — M67441 Ganglion, right hand: Secondary | ICD-10-CM | POA: Diagnosis not present

## 2019-11-01 DIAGNOSIS — M79644 Pain in right finger(s): Secondary | ICD-10-CM | POA: Diagnosis not present

## 2019-11-11 DIAGNOSIS — L814 Other melanin hyperpigmentation: Secondary | ICD-10-CM | POA: Diagnosis not present

## 2019-11-11 DIAGNOSIS — Z08 Encounter for follow-up examination after completed treatment for malignant neoplasm: Secondary | ICD-10-CM | POA: Diagnosis not present

## 2019-11-11 DIAGNOSIS — Z85828 Personal history of other malignant neoplasm of skin: Secondary | ICD-10-CM | POA: Diagnosis not present

## 2019-11-11 DIAGNOSIS — D225 Melanocytic nevi of trunk: Secondary | ICD-10-CM | POA: Diagnosis not present

## 2019-11-11 DIAGNOSIS — X32XXXA Exposure to sunlight, initial encounter: Secondary | ICD-10-CM | POA: Diagnosis not present

## 2019-11-11 DIAGNOSIS — C50912 Malignant neoplasm of unspecified site of left female breast: Secondary | ICD-10-CM | POA: Diagnosis not present

## 2019-11-11 DIAGNOSIS — L570 Actinic keratosis: Secondary | ICD-10-CM | POA: Diagnosis not present

## 2019-11-11 DIAGNOSIS — L821 Other seborrheic keratosis: Secondary | ICD-10-CM | POA: Diagnosis not present

## 2019-12-02 DIAGNOSIS — E7849 Other hyperlipidemia: Secondary | ICD-10-CM | POA: Diagnosis not present

## 2019-12-02 DIAGNOSIS — R7301 Impaired fasting glucose: Secondary | ICD-10-CM | POA: Diagnosis not present

## 2019-12-03 DIAGNOSIS — H52203 Unspecified astigmatism, bilateral: Secondary | ICD-10-CM | POA: Diagnosis not present

## 2019-12-03 DIAGNOSIS — H524 Presbyopia: Secondary | ICD-10-CM | POA: Diagnosis not present

## 2019-12-03 DIAGNOSIS — H5203 Hypermetropia, bilateral: Secondary | ICD-10-CM | POA: Diagnosis not present

## 2019-12-03 DIAGNOSIS — H25813 Combined forms of age-related cataract, bilateral: Secondary | ICD-10-CM | POA: Diagnosis not present

## 2019-12-06 DIAGNOSIS — M71341 Other bursal cyst, right hand: Secondary | ICD-10-CM | POA: Diagnosis not present

## 2019-12-06 DIAGNOSIS — M67449 Ganglion, unspecified hand: Secondary | ICD-10-CM | POA: Insufficient documentation

## 2019-12-09 DIAGNOSIS — M1990 Unspecified osteoarthritis, unspecified site: Secondary | ICD-10-CM | POA: Diagnosis not present

## 2019-12-09 DIAGNOSIS — Z1331 Encounter for screening for depression: Secondary | ICD-10-CM | POA: Diagnosis not present

## 2019-12-09 DIAGNOSIS — I2510 Atherosclerotic heart disease of native coronary artery without angina pectoris: Secondary | ICD-10-CM | POA: Diagnosis not present

## 2019-12-09 DIAGNOSIS — R7301 Impaired fasting glucose: Secondary | ICD-10-CM | POA: Diagnosis not present

## 2019-12-09 DIAGNOSIS — R82998 Other abnormal findings in urine: Secondary | ICD-10-CM | POA: Diagnosis not present

## 2019-12-09 DIAGNOSIS — Z0000 Encounter for general adult medical examination without abnormal findings: Secondary | ICD-10-CM | POA: Diagnosis not present

## 2019-12-09 DIAGNOSIS — N952 Postmenopausal atrophic vaginitis: Secondary | ICD-10-CM | POA: Diagnosis not present

## 2019-12-09 DIAGNOSIS — E785 Hyperlipidemia, unspecified: Secondary | ICD-10-CM | POA: Diagnosis not present

## 2019-12-09 DIAGNOSIS — K760 Fatty (change of) liver, not elsewhere classified: Secondary | ICD-10-CM | POA: Diagnosis not present

## 2019-12-09 DIAGNOSIS — M5136 Other intervertebral disc degeneration, lumbar region: Secondary | ICD-10-CM | POA: Diagnosis not present

## 2019-12-09 DIAGNOSIS — J302 Other seasonal allergic rhinitis: Secondary | ICD-10-CM | POA: Diagnosis not present

## 2019-12-09 DIAGNOSIS — R05 Cough: Secondary | ICD-10-CM | POA: Diagnosis not present

## 2019-12-11 DIAGNOSIS — Z23 Encounter for immunization: Secondary | ICD-10-CM | POA: Diagnosis not present

## 2019-12-13 DIAGNOSIS — Z1212 Encounter for screening for malignant neoplasm of rectum: Secondary | ICD-10-CM | POA: Diagnosis not present

## 2020-01-15 DIAGNOSIS — M25562 Pain in left knee: Secondary | ICD-10-CM | POA: Diagnosis not present

## 2020-02-24 DIAGNOSIS — M9901 Segmental and somatic dysfunction of cervical region: Secondary | ICD-10-CM | POA: Diagnosis not present

## 2020-02-24 DIAGNOSIS — M4727 Other spondylosis with radiculopathy, lumbosacral region: Secondary | ICD-10-CM | POA: Diagnosis not present

## 2020-02-24 DIAGNOSIS — M47812 Spondylosis without myelopathy or radiculopathy, cervical region: Secondary | ICD-10-CM | POA: Diagnosis not present

## 2020-02-24 DIAGNOSIS — M9903 Segmental and somatic dysfunction of lumbar region: Secondary | ICD-10-CM | POA: Diagnosis not present

## 2020-02-24 DIAGNOSIS — M5416 Radiculopathy, lumbar region: Secondary | ICD-10-CM | POA: Diagnosis not present

## 2020-02-24 DIAGNOSIS — M9902 Segmental and somatic dysfunction of thoracic region: Secondary | ICD-10-CM | POA: Diagnosis not present

## 2020-02-24 DIAGNOSIS — M5137 Other intervertebral disc degeneration, lumbosacral region: Secondary | ICD-10-CM | POA: Diagnosis not present

## 2020-02-26 DIAGNOSIS — M9901 Segmental and somatic dysfunction of cervical region: Secondary | ICD-10-CM | POA: Diagnosis not present

## 2020-02-26 DIAGNOSIS — M9902 Segmental and somatic dysfunction of thoracic region: Secondary | ICD-10-CM | POA: Diagnosis not present

## 2020-02-26 DIAGNOSIS — M4727 Other spondylosis with radiculopathy, lumbosacral region: Secondary | ICD-10-CM | POA: Diagnosis not present

## 2020-02-26 DIAGNOSIS — M47812 Spondylosis without myelopathy or radiculopathy, cervical region: Secondary | ICD-10-CM | POA: Diagnosis not present

## 2020-02-26 DIAGNOSIS — M9903 Segmental and somatic dysfunction of lumbar region: Secondary | ICD-10-CM | POA: Diagnosis not present

## 2020-02-26 DIAGNOSIS — M5137 Other intervertebral disc degeneration, lumbosacral region: Secondary | ICD-10-CM | POA: Diagnosis not present

## 2020-02-26 DIAGNOSIS — M5416 Radiculopathy, lumbar region: Secondary | ICD-10-CM | POA: Diagnosis not present

## 2020-03-02 DIAGNOSIS — M4727 Other spondylosis with radiculopathy, lumbosacral region: Secondary | ICD-10-CM | POA: Diagnosis not present

## 2020-03-02 DIAGNOSIS — M5137 Other intervertebral disc degeneration, lumbosacral region: Secondary | ICD-10-CM | POA: Diagnosis not present

## 2020-03-02 DIAGNOSIS — M9903 Segmental and somatic dysfunction of lumbar region: Secondary | ICD-10-CM | POA: Diagnosis not present

## 2020-03-02 DIAGNOSIS — M47812 Spondylosis without myelopathy or radiculopathy, cervical region: Secondary | ICD-10-CM | POA: Diagnosis not present

## 2020-03-02 DIAGNOSIS — M5416 Radiculopathy, lumbar region: Secondary | ICD-10-CM | POA: Diagnosis not present

## 2020-03-02 DIAGNOSIS — M9901 Segmental and somatic dysfunction of cervical region: Secondary | ICD-10-CM | POA: Diagnosis not present

## 2020-03-02 DIAGNOSIS — M9902 Segmental and somatic dysfunction of thoracic region: Secondary | ICD-10-CM | POA: Diagnosis not present

## 2020-03-03 DIAGNOSIS — M4316 Spondylolisthesis, lumbar region: Secondary | ICD-10-CM | POA: Diagnosis not present

## 2020-03-03 DIAGNOSIS — M5126 Other intervertebral disc displacement, lumbar region: Secondary | ICD-10-CM | POA: Diagnosis not present

## 2020-03-03 DIAGNOSIS — M47816 Spondylosis without myelopathy or radiculopathy, lumbar region: Secondary | ICD-10-CM | POA: Diagnosis not present

## 2020-03-04 DIAGNOSIS — M4727 Other spondylosis with radiculopathy, lumbosacral region: Secondary | ICD-10-CM | POA: Diagnosis not present

## 2020-03-04 DIAGNOSIS — M5137 Other intervertebral disc degeneration, lumbosacral region: Secondary | ICD-10-CM | POA: Diagnosis not present

## 2020-03-04 DIAGNOSIS — M9901 Segmental and somatic dysfunction of cervical region: Secondary | ICD-10-CM | POA: Diagnosis not present

## 2020-03-04 DIAGNOSIS — M5416 Radiculopathy, lumbar region: Secondary | ICD-10-CM | POA: Diagnosis not present

## 2020-03-04 DIAGNOSIS — M47812 Spondylosis without myelopathy or radiculopathy, cervical region: Secondary | ICD-10-CM | POA: Diagnosis not present

## 2020-03-04 DIAGNOSIS — M9903 Segmental and somatic dysfunction of lumbar region: Secondary | ICD-10-CM | POA: Diagnosis not present

## 2020-03-04 DIAGNOSIS — M9902 Segmental and somatic dysfunction of thoracic region: Secondary | ICD-10-CM | POA: Diagnosis not present

## 2020-03-09 DIAGNOSIS — M5137 Other intervertebral disc degeneration, lumbosacral region: Secondary | ICD-10-CM | POA: Diagnosis not present

## 2020-03-09 DIAGNOSIS — M5416 Radiculopathy, lumbar region: Secondary | ICD-10-CM | POA: Diagnosis not present

## 2020-03-09 DIAGNOSIS — M9903 Segmental and somatic dysfunction of lumbar region: Secondary | ICD-10-CM | POA: Diagnosis not present

## 2020-03-09 DIAGNOSIS — M9901 Segmental and somatic dysfunction of cervical region: Secondary | ICD-10-CM | POA: Diagnosis not present

## 2020-03-09 DIAGNOSIS — M47812 Spondylosis without myelopathy or radiculopathy, cervical region: Secondary | ICD-10-CM | POA: Diagnosis not present

## 2020-03-09 DIAGNOSIS — M4727 Other spondylosis with radiculopathy, lumbosacral region: Secondary | ICD-10-CM | POA: Diagnosis not present

## 2020-03-09 DIAGNOSIS — M9902 Segmental and somatic dysfunction of thoracic region: Secondary | ICD-10-CM | POA: Diagnosis not present

## 2020-03-13 ENCOUNTER — Other Ambulatory Visit: Payer: Self-pay | Admitting: Internal Medicine

## 2020-03-13 DIAGNOSIS — N281 Cyst of kidney, acquired: Secondary | ICD-10-CM

## 2020-03-17 DIAGNOSIS — M47812 Spondylosis without myelopathy or radiculopathy, cervical region: Secondary | ICD-10-CM | POA: Diagnosis not present

## 2020-03-17 DIAGNOSIS — M4727 Other spondylosis with radiculopathy, lumbosacral region: Secondary | ICD-10-CM | POA: Diagnosis not present

## 2020-03-17 DIAGNOSIS — M9903 Segmental and somatic dysfunction of lumbar region: Secondary | ICD-10-CM | POA: Diagnosis not present

## 2020-03-17 DIAGNOSIS — M5137 Other intervertebral disc degeneration, lumbosacral region: Secondary | ICD-10-CM | POA: Diagnosis not present

## 2020-03-17 DIAGNOSIS — M9902 Segmental and somatic dysfunction of thoracic region: Secondary | ICD-10-CM | POA: Diagnosis not present

## 2020-03-17 DIAGNOSIS — M5416 Radiculopathy, lumbar region: Secondary | ICD-10-CM | POA: Diagnosis not present

## 2020-03-17 DIAGNOSIS — M9901 Segmental and somatic dysfunction of cervical region: Secondary | ICD-10-CM | POA: Diagnosis not present

## 2020-03-18 ENCOUNTER — Ambulatory Visit
Admission: RE | Admit: 2020-03-18 | Discharge: 2020-03-18 | Disposition: A | Discharge status: Home / Self Care | Payer: PPO | Source: Ambulatory Visit | Attending: Internal Medicine | Admitting: Internal Medicine

## 2020-03-18 DIAGNOSIS — N281 Cyst of kidney, acquired: Secondary | ICD-10-CM | POA: Diagnosis not present

## 2020-03-18 DIAGNOSIS — M9901 Segmental and somatic dysfunction of cervical region: Secondary | ICD-10-CM | POA: Diagnosis not present

## 2020-03-18 DIAGNOSIS — M47812 Spondylosis without myelopathy or radiculopathy, cervical region: Secondary | ICD-10-CM | POA: Diagnosis not present

## 2020-03-18 DIAGNOSIS — M4727 Other spondylosis with radiculopathy, lumbosacral region: Secondary | ICD-10-CM | POA: Diagnosis not present

## 2020-03-18 DIAGNOSIS — M5137 Other intervertebral disc degeneration, lumbosacral region: Secondary | ICD-10-CM | POA: Diagnosis not present

## 2020-03-18 DIAGNOSIS — K7689 Other specified diseases of liver: Secondary | ICD-10-CM | POA: Diagnosis not present

## 2020-03-18 DIAGNOSIS — M5416 Radiculopathy, lumbar region: Secondary | ICD-10-CM | POA: Diagnosis not present

## 2020-03-18 DIAGNOSIS — M9903 Segmental and somatic dysfunction of lumbar region: Secondary | ICD-10-CM | POA: Diagnosis not present

## 2020-03-18 DIAGNOSIS — M9902 Segmental and somatic dysfunction of thoracic region: Secondary | ICD-10-CM | POA: Diagnosis not present

## 2020-04-06 DIAGNOSIS — M9903 Segmental and somatic dysfunction of lumbar region: Secondary | ICD-10-CM | POA: Diagnosis not present

## 2020-04-06 DIAGNOSIS — M5137 Other intervertebral disc degeneration, lumbosacral region: Secondary | ICD-10-CM | POA: Diagnosis not present

## 2020-04-06 DIAGNOSIS — M9902 Segmental and somatic dysfunction of thoracic region: Secondary | ICD-10-CM | POA: Diagnosis not present

## 2020-04-06 DIAGNOSIS — M5416 Radiculopathy, lumbar region: Secondary | ICD-10-CM | POA: Diagnosis not present

## 2020-04-06 DIAGNOSIS — M47812 Spondylosis without myelopathy or radiculopathy, cervical region: Secondary | ICD-10-CM | POA: Diagnosis not present

## 2020-04-06 DIAGNOSIS — M4727 Other spondylosis with radiculopathy, lumbosacral region: Secondary | ICD-10-CM | POA: Diagnosis not present

## 2020-04-06 DIAGNOSIS — M9901 Segmental and somatic dysfunction of cervical region: Secondary | ICD-10-CM | POA: Diagnosis not present

## 2020-04-14 DIAGNOSIS — M9903 Segmental and somatic dysfunction of lumbar region: Secondary | ICD-10-CM | POA: Diagnosis not present

## 2020-04-14 DIAGNOSIS — M4727 Other spondylosis with radiculopathy, lumbosacral region: Secondary | ICD-10-CM | POA: Diagnosis not present

## 2020-04-14 DIAGNOSIS — M5137 Other intervertebral disc degeneration, lumbosacral region: Secondary | ICD-10-CM | POA: Diagnosis not present

## 2020-04-14 DIAGNOSIS — M9902 Segmental and somatic dysfunction of thoracic region: Secondary | ICD-10-CM | POA: Diagnosis not present

## 2020-04-14 DIAGNOSIS — M5416 Radiculopathy, lumbar region: Secondary | ICD-10-CM | POA: Diagnosis not present

## 2020-04-14 DIAGNOSIS — M9901 Segmental and somatic dysfunction of cervical region: Secondary | ICD-10-CM | POA: Diagnosis not present

## 2020-04-14 DIAGNOSIS — M47812 Spondylosis without myelopathy or radiculopathy, cervical region: Secondary | ICD-10-CM | POA: Diagnosis not present

## 2020-04-17 DIAGNOSIS — K219 Gastro-esophageal reflux disease without esophagitis: Secondary | ICD-10-CM | POA: Diagnosis not present

## 2020-04-17 DIAGNOSIS — R1013 Epigastric pain: Secondary | ICD-10-CM | POA: Diagnosis not present

## 2020-04-20 DIAGNOSIS — M47812 Spondylosis without myelopathy or radiculopathy, cervical region: Secondary | ICD-10-CM | POA: Diagnosis not present

## 2020-04-20 DIAGNOSIS — M9903 Segmental and somatic dysfunction of lumbar region: Secondary | ICD-10-CM | POA: Diagnosis not present

## 2020-04-20 DIAGNOSIS — M5137 Other intervertebral disc degeneration, lumbosacral region: Secondary | ICD-10-CM | POA: Diagnosis not present

## 2020-04-20 DIAGNOSIS — M5416 Radiculopathy, lumbar region: Secondary | ICD-10-CM | POA: Diagnosis not present

## 2020-04-20 DIAGNOSIS — M4727 Other spondylosis with radiculopathy, lumbosacral region: Secondary | ICD-10-CM | POA: Diagnosis not present

## 2020-04-20 DIAGNOSIS — M9901 Segmental and somatic dysfunction of cervical region: Secondary | ICD-10-CM | POA: Diagnosis not present

## 2020-04-20 DIAGNOSIS — M9902 Segmental and somatic dysfunction of thoracic region: Secondary | ICD-10-CM | POA: Diagnosis not present

## 2020-04-27 DIAGNOSIS — M5137 Other intervertebral disc degeneration, lumbosacral region: Secondary | ICD-10-CM | POA: Diagnosis not present

## 2020-04-27 DIAGNOSIS — M4727 Other spondylosis with radiculopathy, lumbosacral region: Secondary | ICD-10-CM | POA: Diagnosis not present

## 2020-04-27 DIAGNOSIS — M9902 Segmental and somatic dysfunction of thoracic region: Secondary | ICD-10-CM | POA: Diagnosis not present

## 2020-04-27 DIAGNOSIS — M47812 Spondylosis without myelopathy or radiculopathy, cervical region: Secondary | ICD-10-CM | POA: Diagnosis not present

## 2020-04-27 DIAGNOSIS — M5416 Radiculopathy, lumbar region: Secondary | ICD-10-CM | POA: Diagnosis not present

## 2020-04-27 DIAGNOSIS — M9903 Segmental and somatic dysfunction of lumbar region: Secondary | ICD-10-CM | POA: Diagnosis not present

## 2020-04-27 DIAGNOSIS — M9901 Segmental and somatic dysfunction of cervical region: Secondary | ICD-10-CM | POA: Diagnosis not present

## 2020-05-01 ENCOUNTER — Other Ambulatory Visit: Payer: Self-pay | Admitting: General Surgery

## 2020-05-01 DIAGNOSIS — Z853 Personal history of malignant neoplasm of breast: Secondary | ICD-10-CM | POA: Diagnosis not present

## 2020-05-01 DIAGNOSIS — K7689 Other specified diseases of liver: Secondary | ICD-10-CM | POA: Diagnosis not present

## 2020-05-01 DIAGNOSIS — R109 Unspecified abdominal pain: Secondary | ICD-10-CM | POA: Diagnosis not present

## 2020-05-06 DIAGNOSIS — M9901 Segmental and somatic dysfunction of cervical region: Secondary | ICD-10-CM | POA: Diagnosis not present

## 2020-05-06 DIAGNOSIS — M4727 Other spondylosis with radiculopathy, lumbosacral region: Secondary | ICD-10-CM | POA: Diagnosis not present

## 2020-05-06 DIAGNOSIS — M47812 Spondylosis without myelopathy or radiculopathy, cervical region: Secondary | ICD-10-CM | POA: Diagnosis not present

## 2020-05-06 DIAGNOSIS — M9902 Segmental and somatic dysfunction of thoracic region: Secondary | ICD-10-CM | POA: Diagnosis not present

## 2020-05-06 DIAGNOSIS — M5137 Other intervertebral disc degeneration, lumbosacral region: Secondary | ICD-10-CM | POA: Diagnosis not present

## 2020-05-06 DIAGNOSIS — M5416 Radiculopathy, lumbar region: Secondary | ICD-10-CM | POA: Diagnosis not present

## 2020-05-06 DIAGNOSIS — M9903 Segmental and somatic dysfunction of lumbar region: Secondary | ICD-10-CM | POA: Diagnosis not present

## 2020-05-11 DIAGNOSIS — M9903 Segmental and somatic dysfunction of lumbar region: Secondary | ICD-10-CM | POA: Diagnosis not present

## 2020-05-11 DIAGNOSIS — M5137 Other intervertebral disc degeneration, lumbosacral region: Secondary | ICD-10-CM | POA: Diagnosis not present

## 2020-05-11 DIAGNOSIS — M47812 Spondylosis without myelopathy or radiculopathy, cervical region: Secondary | ICD-10-CM | POA: Diagnosis not present

## 2020-05-11 DIAGNOSIS — M9901 Segmental and somatic dysfunction of cervical region: Secondary | ICD-10-CM | POA: Diagnosis not present

## 2020-05-11 DIAGNOSIS — M5416 Radiculopathy, lumbar region: Secondary | ICD-10-CM | POA: Diagnosis not present

## 2020-05-11 DIAGNOSIS — M4727 Other spondylosis with radiculopathy, lumbosacral region: Secondary | ICD-10-CM | POA: Diagnosis not present

## 2020-05-11 DIAGNOSIS — M9902 Segmental and somatic dysfunction of thoracic region: Secondary | ICD-10-CM | POA: Diagnosis not present

## 2020-05-12 DIAGNOSIS — L570 Actinic keratosis: Secondary | ICD-10-CM | POA: Diagnosis not present

## 2020-05-12 DIAGNOSIS — L814 Other melanin hyperpigmentation: Secondary | ICD-10-CM | POA: Diagnosis not present

## 2020-05-12 DIAGNOSIS — Z85828 Personal history of other malignant neoplasm of skin: Secondary | ICD-10-CM | POA: Diagnosis not present

## 2020-05-12 DIAGNOSIS — Z08 Encounter for follow-up examination after completed treatment for malignant neoplasm: Secondary | ICD-10-CM | POA: Diagnosis not present

## 2020-05-12 DIAGNOSIS — L578 Other skin changes due to chronic exposure to nonionizing radiation: Secondary | ICD-10-CM | POA: Diagnosis not present

## 2020-05-12 DIAGNOSIS — L821 Other seborrheic keratosis: Secondary | ICD-10-CM | POA: Diagnosis not present

## 2020-05-12 DIAGNOSIS — C50912 Malignant neoplasm of unspecified site of left female breast: Secondary | ICD-10-CM | POA: Diagnosis not present

## 2020-05-12 DIAGNOSIS — L820 Inflamed seborrheic keratosis: Secondary | ICD-10-CM | POA: Diagnosis not present

## 2020-05-12 DIAGNOSIS — L538 Other specified erythematous conditions: Secondary | ICD-10-CM | POA: Diagnosis not present

## 2020-05-12 DIAGNOSIS — D225 Melanocytic nevi of trunk: Secondary | ICD-10-CM | POA: Diagnosis not present

## 2020-05-12 DIAGNOSIS — L298 Other pruritus: Secondary | ICD-10-CM | POA: Diagnosis not present

## 2020-05-26 NOTE — Progress Notes (Signed)
[id number] [address], [address] AT [facility] OF [address] [place] [address] Phone: [phone] Fax: [phone]      Your procedure is scheduled on October 26  Report to [facility] Main Entrance "A" at 0530 A.M., and check in at the Admitting office.  Call this number if you have problems the morning of surgery:  [phone]  Call [phone] if you have any questions prior to your surgery date Monday-Friday 8am-4pm    Remember:  Do not eat after midnight the night before your surgery  You may drink clear liquids until 0430 am the morning of your surgery.   Clear liquids allowed are: Water, Non-Citrus Juices (without pulp), Carbonated Beverages, Clear Tea, Black Coffee Only, and Gatorade    Take these medicines the morning of surgery with A SIP OF WATER  cetirizine (ZYRTEC)  ezetimibe (ZETIA) fluticasone (FLONASE)  If needed  Follow your surgeon's instructions on when to stop Aspirin.  If no instructions were given by your surgeon then you will need to call the office to get those instructions.    As of today, STOP taking any Aspirin (unless otherwise instructed by your surgeon) Aleve, Naproxen, Ibuprofen, Motrin, Advil, Goody's, BC's, all herbal medications, fish oil, and all vitamins.                      Do not wear jewelry, make up, or nail polish            Do not wear lotions, powders, perfumes/colognes, or deodorant.            Do not shave 48 hours prior to surgery.              Do not bring valuables to the hospital.            [facility] is not responsible for any belongings or valuables.  Do NOT Smoke (Tobacco/Vaping) or drink Alcohol 24 hours prior to your procedure If you use a CPAP at night, you may bring all equipment for your overnight stay.   Contacts, glasses, dentures or bridgework may not be worn into surgery.      For patients admitted to the hospital, discharge time will be determined by  your treatment team.   Patients discharged the day of surgery will not be allowed to drive home, and someone needs to stay with them for 24 hours.    Special instructions:   [place]- Preparing For Surgery  Before surgery, you can play an important role. Because skin is not sterile, your skin needs to be as free of germs as possible. You can reduce the number of germs on your skin by washing with CHG (chlorahexidine gluconate) Soap before surgery.  CHG is an antiseptic cleaner which kills germs and bonds with the skin to continue killing germs even after washing.    Oral Hygiene is also important to reduce your risk of infection.  Remember - BRUSH YOUR TEETH THE MORNING OF SURGERY WITH YOUR REGULAR TOOTHPASTE  Please do not use if you have an allergy to CHG or antibacterial soaps. If your skin becomes reddened/irritated stop using the CHG.  Do not shave (including legs and underarms) for at least 48 hours prior to first CHG shower. It is OK to shave your face.  Please follow these instructions carefully.   1. Shower the NIGHT BEFORE SURGERY and the MORNING OF SURGERY with CHG Soap.  2. If you chose to wash your hair, wash your hair first as usual with your normal shampoo.  3. After you shampoo, rinse your hair and body thoroughly to remove the shampoo.  4. Use CHG as you would any other liquid soap. You can apply CHG directly to the skin and wash gently with a scrungie or a clean washcloth.   5. Apply the CHG Soap to your body ONLY FROM THE NECK DOWN.  Do not use on open wounds or open sores. Avoid contact with your eyes, ears, mouth and genitals (private parts). Wash Face and genitals (private parts)  with your normal soap.   6. Wash thoroughly, paying special attention to the area where your surgery will be performed.  7. Thoroughly rinse your body with warm water from the neck down.  8. DO NOT shower/wash with your normal soap after using and rinsing off the CHG Soap.  9. Pat  yourself dry with a CLEAN TOWEL.  10. Wear CLEAN PAJAMAS to bed the night before surgery  11. Place CLEAN SHEETS on your bed the night of your first shower and DO NOT SLEEP WITH PETS.   Day of Surgery: Wear Clean/Comfortable clothing the morning of surgery Do not apply any deodorants/lotions.   Remember to brush your teeth WITH YOUR REGULAR TOOTHPASTE.   Please read over the following fact sheets that you were given.

## 2020-05-27 ENCOUNTER — Other Ambulatory Visit: Payer: Self-pay

## 2020-05-27 ENCOUNTER — Encounter (HOSPITAL_COMMUNITY): Payer: Self-pay

## 2020-05-27 ENCOUNTER — Encounter (HOSPITAL_COMMUNITY)
Admission: RE | Admit: 2020-05-27 | Discharge: 2020-05-27 | Disposition: A | Discharge status: Home / Self Care | Payer: PPO | Source: Ambulatory Visit | Attending: General Surgery | Admitting: General Surgery

## 2020-05-27 DIAGNOSIS — Z01812 Encounter for preprocedural laboratory examination: Secondary | ICD-10-CM | POA: Insufficient documentation

## 2020-05-27 HISTORY — DX: Ventral hernia without obstruction or gangrene: K43.9

## 2020-05-27 HISTORY — DX: Other specified diseases of liver: K76.89

## 2020-05-27 HISTORY — DX: Personal history of other diseases of the digestive system: Z87.19

## 2020-05-27 HISTORY — DX: Nausea with vomiting, unspecified: R11.2

## 2020-05-27 HISTORY — DX: Other specified postprocedural states: Z98.890

## 2020-05-27 LAB — CBC WITH DIFFERENTIAL/PLATELET
Abs Immature Granulocytes: 0.02 10*3/uL (ref 0.00–0.07)
Basophils Absolute: 0.1 10*3/uL (ref 0.0–0.1)
Basophils Relative: 1 %
Eosinophils Absolute: 0.4 10*3/uL (ref 0.0–0.5)
Eosinophils Relative: 6 %
HCT: 44.5 % (ref 36.0–46.0)
Hemoglobin: 14.7 g/dL (ref 12.0–15.0)
Immature Granulocytes: 0 %
Lymphocytes Relative: 28 %
Lymphs Abs: 1.7 10*3/uL (ref 0.7–4.0)
MCH: 31.3 pg (ref 26.0–34.0)
MCHC: 33 g/dL (ref 30.0–36.0)
MCV: 94.9 fL (ref 80.0–100.0)
Monocytes Absolute: 0.6 10*3/uL (ref 0.1–1.0)
Monocytes Relative: 10 %
Neutro Abs: 3.3 10*3/uL (ref 1.7–7.7)
Neutrophils Relative %: 55 %
Platelets: 272 10*3/uL (ref 150–400)
RBC: 4.69 MIL/uL (ref 3.87–5.11)
RDW: 12.1 % (ref 11.5–15.5)
WBC: 6.1 10*3/uL (ref 4.0–10.5)
nRBC: 0 % (ref 0.0–0.2)

## 2020-05-27 LAB — PROTIME-INR
INR: 1 (ref 0.8–1.2)
Prothrombin Time: 12.7 seconds (ref 11.4–15.2)

## 2020-05-27 LAB — COMPREHENSIVE METABOLIC PANEL
ALT: 20 U/L (ref 0–44)
AST: 22 U/L (ref 15–41)
Albumin: 4 g/dL (ref 3.5–5.0)
Alkaline Phosphatase: 92 U/L (ref 38–126)
Anion gap: 8 (ref 5–15)
BUN: 13 mg/dL (ref 8–23)
CO2: 27 mmol/L (ref 22–32)
Calcium: 8.9 mg/dL (ref 8.9–10.3)
Chloride: 103 mmol/L (ref 98–111)
Creatinine, Ser: 0.65 mg/dL (ref 0.44–1.00)
GFR, Estimated: >60 mL/min (ref >60)
Glucose, Bld: 102 mg/dL — ABNORMAL HIGH (ref 70–99)
Potassium: 4 mmol/L (ref 3.5–5.1)
Sodium: 138 mmol/L (ref 135–145)
Total Bilirubin: 0.7 mg/dL (ref 0.3–1.2)
Total Protein: 6.7 g/dL (ref 6.5–8.1)

## 2020-05-27 LAB — TYPE AND SCREEN
ABO/RH(D): O POS
Antibody Screen: NEGATIVE

## 2020-05-27 NOTE — Progress Notes (Signed)
PCP - Dr. Crist Infante Cardiologist - denies  PPM/ICD - denies  Chest x-ray - N/A EKG - N/A Stress Test - denies  ECHO - denies Cardiac Cath - denies  Sleep Study - denies CPAP - N/A  DM: denies  Blood Thinner Instructions: N/A Aspirin Instructions: N/A  ERAS Protcol - Yes PRE-SURGERY Ensure or G2- Ensure given  COVID TEST- Scheduled for 05/29/2020. Patient verbalized understanding of self-quarantine instructions, appointment time and place.  Anesthesia review: No  Patient denies shortness of breath, fever, cough and chest pain at PAT appointment  All instructions explained to the patient, with a verbal understanding of the material. Patient agrees to go over the instructions while at home for a better understanding. Patient also instructed to self quarantine after being tested for COVID-19. The opportunity to ask questions was provided.

## 2020-05-29 ENCOUNTER — Other Ambulatory Visit (HOSPITAL_COMMUNITY)
Admission: RE | Admit: 2020-05-29 | Discharge: 2020-05-29 | Disposition: A | Discharge status: Home / Self Care | Payer: PPO | Source: Ambulatory Visit | Attending: General Surgery | Admitting: General Surgery

## 2020-05-29 DIAGNOSIS — Z20822 Contact with and (suspected) exposure to covid-19: Secondary | ICD-10-CM | POA: Insufficient documentation

## 2020-05-29 DIAGNOSIS — Z01812 Encounter for preprocedural laboratory examination: Secondary | ICD-10-CM | POA: Insufficient documentation

## 2020-05-29 LAB — SARS CORONAVIRUS 2 (TAT 6-24 HRS): SARS Coronavirus 2: NEGATIVE

## 2020-06-01 NOTE — H&P (Signed)
Krista Carney Location: [facility] Patient #: [id number] DOB: 07/22/1946 Married / Language: English / Race: White Female   History of Present Illness The patient is a 74 year old female who presents with a liver mass. Pt is a 74 yo F referred by Dr. Cristina Gong 04/2020 for a right liver cyst. The patient has been having episodes of right sided abdominal pain and swelling. this was sometimes RUQ and sometimes RLQ. The pain is brief, but is fairly significant when it occurs, which is multiple times a week. It has been going on for 3 years. She has had colonoscopy, endoscopy, and more. She had a MRI of the abdomen in 2021 which showed a liver cyst, but this was not felt to represent her pain. She recently had a renal ultrasound redemonstrating the cyst. She presents to discuss surgical management. The pain seems to get a bit better if she stops what she is doing and presses on her right abdomen.   MR abd 11/2016 IMPRESSION: 1. Benign-appearing liver cysts, a few of which are minimally complex, largest 8.2 cm in the far inferior right liver lobe. No suspicious liver masses. 2. Mild diffuse hepatic steatosis. 3. Normal gallbladder with Phrygian cap. No cholelithiasis. No gallbladder mass. No biliary ductal dilatation. 4. Probable pancreas divisum.  renal u/s 03/2020 CLINICAL DATA: Reported right renal cyst seen on prior outside MR  EXAM: RENAL / URINARY TRACT ULTRASOUND COMPLETE  COMPARISON: Abdominal MRI November 24, 2016  FINDINGS: Right Kidney:  Renal measurements: 11.3 x 4.3 x 4.3 cm = volume: 110.3 mL . Echogenicity and renal cortical thickness are within normal limits. No mass, perinephric fluid, or hydronephrosis visualized. No sonographically demonstrable calculus or ureterectasis.  Left Kidney:  Renal measurements: 10.1 x 5.2 x 5.0 cm = volume: 135.2 mL. Echogenicity and renal cortical thickness are within normal limits. No mass, perinephric fluid, or  hydronephrosis visualized. No sonographically demonstrable calculus or ureterectasis.  Bladder:  Appears normal for degree of bladder distention.  Other:  There is a cyst arising in the right lobe of the liver measuring 10.0 x 8.2 x 9.6 cm which contains occasional thin septation. This cyst appears similar to findings on prior abdominal MRI from 2018.  IMPRESSION: Large cyst with occasional septations arising in the right lobe of the liver. No renal masses are evident. Kidneys bilaterally appear normal.   Past Surgical History  Breast Augmentation  Bilateral. Breast Biopsy  Right. multiple Breast Mass; Local Excision  Right. Breast Reconstruction  Bilateral. Cesarean Section - Multiple  Colon Polyp Removal - Colonoscopy  Foot Surgery  Bilateral. Hysterectomy (not due to cancer) - Partial  Mammoplasty; Reduction  Bilateral. Mastectomy  Bilateral.  Diagnostic Studies History  Colonoscopy  1-5 years ago Mammogram  >3 years ago Pap Smear  >5 years ago  Allergies No Known Drug Allergies   Medication History Zolpidem Tartrate (10MG  Tablet, Oral) Active. Ezetimibe (10MG  Tablet, Oral) Active. Tretinoin (0.1% Cream, External) Active. ZyrTEC Allergy (10MG  Tablet, Oral) Active. Medications Reconciled  Social History  Alcohol use  Moderate alcohol use. Caffeine use  Coffee, Tea. No drug use  Tobacco use  Never smoker.  Family History Alcohol Abuse  Father, Son. Arthritis  Mother, Sister. Colon Polyps  Sister. Heart Disease  Mother. Hypertension  Mother. Ischemic Bowel Disease  Sister. Melanoma  Sister. Prostate Cancer  Son. Thyroid problems  Mother, Sister.  Pregnancy / Birth History  Age at menarche  5 years. Age of menopause  34-60 Contraceptive History  Oral contraceptives.  Gravida  2 Length (months) of breastfeeding  3-6 Maternal age  87-20 Para  2  Other Problems  Arthritis  Back Pain  Breast Cancer   Gastroesophageal Reflux Disease  Lump In Breast     Review of Systems  General Present- Night Sweats. Not Present- Appetite Loss, Chills, Fatigue, Fever, Weight Gain and Weight Loss. Skin Not Present- Change in Wart/Mole, Dryness, Hives, Jaundice, New Lesions, Non-Healing Wounds, Rash and Ulcer. HEENT Present- Seasonal Allergies and Wears glasses/contact lenses. Not Present- Earache, Hearing Loss, Hoarseness, Nose Bleed, Oral Ulcers, Ringing in the Ears, Sinus Pain, Sore Throat, Visual Disturbances and Yellow Eyes. Respiratory Not Present- Bloody sputum, Chronic Cough, Difficulty Breathing, Snoring and Wheezing. Cardiovascular Present- Leg Cramps. Not Present- Chest Pain, Difficulty Breathing Lying Down, Palpitations, Rapid Heart Rate, Shortness of Breath and Swelling of Extremities. Gastrointestinal Present- Abdominal Pain and Gets full quickly at meals. Not Present- Bloating, Bloody Stool, Change in Bowel Habits, Chronic diarrhea, Constipation, Difficulty Swallowing, Excessive gas, Hemorrhoids, Indigestion, Nausea, Rectal Pain and Vomiting. Female Genitourinary Present- Frequency and Pelvic Pain. Not Present- Nocturia, Painful Urination and Urgency. Musculoskeletal Present- Back Pain, Joint Stiffness and Muscle Pain. Not Present- Joint Pain, Muscle Weakness and Swelling of Extremities. Neurological Present- Headaches. Not Present- Decreased Memory, Fainting, Numbness, Seizures, Tingling, Tremor, Trouble walking and Weakness. Psychiatric Not Present- Anxiety, Bipolar, Change in Sleep Pattern, Depression, Fearful and Frequent crying. Endocrine Present- Hot flashes. Not Present- Cold Intolerance, Excessive Hunger, Hair Changes, Heat Intolerance and New Diabetes. Hematology Not Present- Blood Thinners, Easy Bruising, Excessive bleeding, Gland problems, HIV and Persistent Infections.  Vitals Weight: 150.38 lb Height: 64.5in Body Surface Area: 1.74 m Body Mass Index: 25.41 kg/m   Temp.: 97.28F (Temporal)  Pulse: 91 (Regular)  P.OX: 98% (Room air) BP: 158/88(Sitting, Left Arm, Standard)       Physical Exam General Mental Status-Alert. General Appearance-Consistent with stated age. Hydration-Well hydrated. Voice-Normal.  Head and Neck Head-normocephalic, atraumatic with no lesions or palpable masses. Trachea-midline. Thyroid Gland Characteristics - normal size and consistency.  Eye Eyeball - Bilateral-Extraocular movements intact. Sclera/Conjunctiva - Bilateral-No scleral icterus.  Chest and Lung Exam Chest and lung exam reveals -quiet, even and easy respiratory effort with no use of accessory muscles and on auscultation, normal breath sounds, no adventitious sounds and normal vocal resonance. Inspection Chest Wall - Normal. Back - normal.  Cardiovascular Cardiovascular examination reveals -normal heart sounds, regular rate and rhythm with no murmurs and normal pedal pulses bilaterally.  Abdomen Inspection Inspection of the abdomen reveals - No Hernias. Palpation/Percussion Palpation and Percussion of the abdomen reveal - Soft, Non Tender, No Rebound tenderness, No Rigidity (guarding) and No hepatosplenomegaly. Auscultation Auscultation of the abdomen reveals - Bowel sounds normal. Note: fullness right mid abdomen. point tenderness at the edge of the rectus abdominus on the right about 2/3 of the way down.   Neurologic Neurologic evaluation reveals -alert and oriented x 3 with no impairment of recent or remote memory. Mental Status-Normal.  Musculoskeletal Global Assessment -Note: no gross deformities.  Normal Exam - Left-Upper Extremity Strength Normal and Lower Extremity Strength Normal. Normal Exam - Right-Upper Extremity Strength Normal and Lower Extremity Strength Normal.  Lymphatic Head & Neck  General Head & Neck Lymphatics: Bilateral - Description - Normal. Axillary  General Axillary  Region: Bilateral - Description - Normal. Tenderness - Non Tender. Femoral & Inguinal  Generalized Femoral & Inguinal Lymphatics: Bilateral - Description - No Generalized lymphadenopathy.    Assessment & Plan  LIVER CYST (K76.89) Impression: This large  cyst certainly could be the reason for her pain. I will plan robotic cyst unroofing. The gallbladder may need to be removed if the cyst is too close to it and it would leave it untethered.  I discussed risks including bleeding and bile leak. I discussed bowel habit changes if the GB comes out.  I would keep her overnight, but let her go home the next day if no evidence of bleeding.  She would like to proceed. HISTORY OF BREAST CANCER (Z85.3) Impression: No clinical evidence of disease. RIGHT SIDED ABDOMINAL PAIN (R10.9) Impression: Likely related to cyst, but will look wtih camera to rule out a spigelian hernia on the right rectus border. If there is one present, I would do primary repair. Current Plans You are being scheduled for surgery- Our schedulers will call you.  You should hear from our office's scheduling department within 5 working days about the location, date, and time of surgery. We try to make accommodations for patient's preferences in scheduling surgery, but sometimes the OR schedule or the surgeon's schedule prevents [place] from making those accommodations.  If you have not heard from our office [phone]) in 5 working days, call the office and ask for your surgeon's nurse.  If you have other questions about your diagnosis, plan, or surgery, call the office and ask for your surgeon's nurse.  Pt Education - [facility] General Post-op HCI

## 2020-06-01 NOTE — Anesthesia Preprocedure Evaluation (Addendum)
Anesthesia Evaluation  Patient identified by MRN, date of birth, ID band Patient awake    Reviewed: Allergy & Precautions, NPO status , Patient's Chart, lab work & pertinent test results  Airway Mallampati: II  TM Distance: >3 FB Neck ROM: Full    Dental  (+) Dental Advisory Given   Pulmonary neg pulmonary ROS,    breath sounds clear to auscultation       Cardiovascular negative cardio ROS   Rhythm:Regular Rate:Normal     Neuro/Psych negative neurological ROS     GI/Hepatic negative GI ROS, Neg liver ROS,   Endo/Other  negative endocrine ROS  Renal/GU negative Renal ROS     Musculoskeletal   Abdominal   Peds  Hematology negative hematology ROS (+)   Anesthesia Other Findings   Reproductive/Obstetrics                            Lab Results  Component Value Date   WBC 6.1 05/27/2020   HGB 14.7 05/27/2020   HCT 44.5 05/27/2020   MCV 94.9 05/27/2020   PLT 272 05/27/2020   Lab Results  Component Value Date   CREATININE 0.65 05/27/2020   BUN 13 05/27/2020   NA 138 05/27/2020   K 4.0 05/27/2020   CL 103 05/27/2020   CO2 27 05/27/2020    Anesthesia Physical Anesthesia Plan  ASA: II  Anesthesia Plan: General   Post-op Pain Management:    Induction:   PONV Risk Score and Plan: 3 and Dexamethasone, Ondansetron, Treatment may vary due to age or medical condition and Midazolam  Airway Management Planned: Oral ETT  Additional Equipment:   Intra-op Plan:   Post-operative Plan: Extubation in OR  Informed Consent: I have reviewed the patients History and Physical, chart, labs and discussed the procedure including the risks, benefits and alternatives for the proposed anesthesia with the patient or authorized representative who has indicated his/her understanding and acceptance.     Dental advisory given  Plan Discussed with: CRNA  Anesthesia Plan Comments:         Anesthesia Quick Evaluation

## 2020-06-02 ENCOUNTER — Ambulatory Visit (HOSPITAL_COMMUNITY): Payer: PPO | Admitting: Certified Registered"

## 2020-06-02 ENCOUNTER — Encounter (HOSPITAL_COMMUNITY): Payer: Self-pay | Admitting: General Surgery

## 2020-06-02 ENCOUNTER — Encounter (HOSPITAL_COMMUNITY)
Admission: RE | Disposition: A | Discharge status: Home / Self Care | Payer: Self-pay | Source: Home / Self Care | Attending: General Surgery

## 2020-06-02 ENCOUNTER — Ambulatory Visit (HOSPITAL_COMMUNITY)
Admission: RE | Admit: 2020-06-02 | Discharge: 2020-06-03 | Disposition: A | Discharge status: Home / Self Care | Payer: PPO | Attending: General Surgery | Admitting: General Surgery

## 2020-06-02 ENCOUNTER — Other Ambulatory Visit: Payer: Self-pay

## 2020-06-02 DIAGNOSIS — K811 Chronic cholecystitis: Secondary | ICD-10-CM | POA: Diagnosis not present

## 2020-06-02 DIAGNOSIS — K7689 Other specified diseases of liver: Secondary | ICD-10-CM | POA: Diagnosis not present

## 2020-06-02 DIAGNOSIS — K760 Fatty (change of) liver, not elsewhere classified: Secondary | ICD-10-CM | POA: Diagnosis not present

## 2020-06-02 DIAGNOSIS — L900 Lichen sclerosus et atrophicus: Secondary | ICD-10-CM | POA: Diagnosis not present

## 2020-06-02 DIAGNOSIS — E559 Vitamin D deficiency, unspecified: Secondary | ICD-10-CM | POA: Diagnosis not present

## 2020-06-02 DIAGNOSIS — K8020 Calculus of gallbladder without cholecystitis without obstruction: Secondary | ICD-10-CM | POA: Diagnosis not present

## 2020-06-02 LAB — ABO/RH: ABO/RH(D): O POS

## 2020-06-02 SURGERY — CHOLECYSTECTOMY, ROBOT-ASSISTED, LAPAROSCOPIC
Anesthesia: General | Site: Abdomen

## 2020-06-02 MED ORDER — 0.9 % SODIUM CHLORIDE (POUR BTL) OPTIME
TOPICAL | Status: DC | PRN
  Administered 2020-06-02: 1000 mL

## 2020-06-02 MED ORDER — ONDANSETRON HCL 4 MG/2ML IJ SOLN
INTRAMUSCULAR | Status: AC
  Filled 2020-06-02: qty 2

## 2020-06-02 MED ORDER — ONDANSETRON HCL 4 MG/2ML IJ SOLN
INTRAMUSCULAR | Status: DC | PRN
  Administered 2020-06-02: 4 mg via INTRAVENOUS

## 2020-06-02 MED ORDER — ROCURONIUM BROMIDE 10 MG/ML (PF) SYRINGE
PREFILLED_SYRINGE | INTRAVENOUS | Status: AC
  Filled 2020-06-02: qty 10

## 2020-06-02 MED ORDER — ONDANSETRON 4 MG PO TBDP: 4.0000 mg | ORAL_TABLET | Freq: Four times a day (QID) | ORAL | Status: DC | PRN

## 2020-06-02 MED ORDER — SIMETHICONE 80 MG PO CHEW
40.0000 mg | CHEWABLE_TABLET | Freq: Four times a day (QID) | ORAL | Status: DC | PRN
  Administered 2020-06-02: 40 mg via ORAL
  Filled 2020-06-02: qty 1

## 2020-06-02 MED ORDER — OXYCODONE HCL 5 MG PO TABS
5.0000 mg | ORAL_TABLET | ORAL | Status: DC | PRN
  Administered 2020-06-02 – 2020-06-03 (×5): 10 mg via ORAL
  Filled 2020-06-02 (×4): qty 2

## 2020-06-02 MED ORDER — FENTANYL CITRATE (PF) 250 MCG/5ML IJ SOLN
INTRAMUSCULAR | Status: DC | PRN
  Administered 2020-06-02 (×2): 50 ug via INTRAVENOUS
  Administered 2020-06-02: 25 ug via INTRAVENOUS
  Administered 2020-06-02: 50 ug via INTRAVENOUS
  Administered 2020-06-02: 25 ug via INTRAVENOUS

## 2020-06-02 MED ORDER — FLUTICASONE PROPIONATE 50 MCG/ACT NA SUSP
1.0000 | Freq: Every day | NASAL | Status: DC | PRN
  Filled 2020-06-02: qty 16

## 2020-06-02 MED ORDER — CHLORHEXIDINE GLUCONATE CLOTH 2 % EX PADS: 6.0000 | MEDICATED_PAD | Freq: Once | CUTANEOUS | Status: DC

## 2020-06-02 MED ORDER — MIDAZOLAM HCL 2 MG/2ML IJ SOLN
INTRAMUSCULAR | Status: AC
  Filled 2020-06-02: qty 2

## 2020-06-02 MED ORDER — PROCHLORPERAZINE EDISYLATE 10 MG/2ML IJ SOLN: 5.0000 mg | Freq: Four times a day (QID) | INTRAMUSCULAR | Status: DC | PRN

## 2020-06-02 MED ORDER — FENTANYL CITRATE (PF) 250 MCG/5ML IJ SOLN
INTRAMUSCULAR | Status: AC
  Filled 2020-06-02: qty 5

## 2020-06-02 MED ORDER — CEFAZOLIN SODIUM-DEXTROSE 2-4 GM/100ML-% IV SOLN
2.0000 g | Freq: Three times a day (TID) | INTRAVENOUS | Status: AC
  Administered 2020-06-02: 2 g via INTRAVENOUS
  Filled 2020-06-02: qty 100

## 2020-06-02 MED ORDER — ACETAMINOPHEN 500 MG PO TABS
1000.0000 mg | ORAL_TABLET | ORAL | Status: AC
  Administered 2020-06-02: 1000 mg via ORAL
  Filled 2020-06-02: qty 2

## 2020-06-02 MED ORDER — LIDOCAINE 2% (20 MG/ML) 5 ML SYRINGE
INTRAMUSCULAR | Status: AC
  Filled 2020-06-02: qty 5

## 2020-06-02 MED ORDER — BUPIVACAINE LIPOSOME 1.3 % IJ SUSP
20.0000 mL | INTRAMUSCULAR | Status: AC
  Administered 2020-06-02: 20 mL
  Filled 2020-06-02: qty 20

## 2020-06-02 MED ORDER — LORATADINE 10 MG PO TABS
10.0000 mg | ORAL_TABLET | Freq: Every day | ORAL | Status: DC
  Filled 2020-06-02: qty 1

## 2020-06-02 MED ORDER — ACETAMINOPHEN 325 MG PO TABS
650.0000 mg | ORAL_TABLET | Freq: Four times a day (QID) | ORAL | Status: DC | PRN
  Administered 2020-06-02: 650 mg via ORAL
  Filled 2020-06-02: qty 2

## 2020-06-02 MED ORDER — ONDANSETRON HCL 4 MG/2ML IJ SOLN
4.0000 mg | Freq: Four times a day (QID) | INTRAMUSCULAR | Status: DC | PRN
  Administered 2020-06-03: 4 mg via INTRAVENOUS
  Filled 2020-06-02: qty 2

## 2020-06-02 MED ORDER — PROPOFOL 10 MG/ML IV BOLUS
INTRAVENOUS | Status: DC | PRN
  Administered 2020-06-02: 150 mg via INTRAVENOUS

## 2020-06-02 MED ORDER — AMISULPRIDE (ANTIEMETIC) 5 MG/2ML IV SOLN: 10.0000 mg | Freq: Once | INTRAVENOUS | Status: DC | PRN

## 2020-06-02 MED ORDER — FENTANYL CITRATE (PF) 100 MCG/2ML IJ SOLN
25.0000 ug | INTRAMUSCULAR | Status: DC | PRN
  Administered 2020-06-02: 25 ug via INTRAVENOUS
  Administered 2020-06-02: 50 ug via INTRAVENOUS

## 2020-06-02 MED ORDER — SODIUM CHLORIDE 0.9 % IR SOLN
Status: DC | PRN
  Administered 2020-06-02 (×2): 1000 mL

## 2020-06-02 MED ORDER — MIDAZOLAM HCL 5 MG/5ML IJ SOLN
INTRAMUSCULAR | Status: DC | PRN
  Administered 2020-06-02 (×2): 1 mg via INTRAVENOUS

## 2020-06-02 MED ORDER — FENTANYL CITRATE (PF) 100 MCG/2ML IJ SOLN
INTRAMUSCULAR | Status: AC
  Administered 2020-06-02: 25 ug via INTRAVENOUS
  Filled 2020-06-02: qty 2

## 2020-06-02 MED ORDER — ZOLPIDEM TARTRATE 5 MG PO TABS
5.0000 mg | ORAL_TABLET | Freq: Every evening | ORAL | Status: DC | PRN
  Administered 2020-06-03: 5 mg via ORAL
  Filled 2020-06-02 (×2): qty 1

## 2020-06-02 MED ORDER — SUGAMMADEX SODIUM 200 MG/2ML IV SOLN
INTRAVENOUS | Status: DC | PRN
  Administered 2020-06-02: 200 mg via INTRAVENOUS

## 2020-06-02 MED ORDER — PROPOFOL 10 MG/ML IV BOLUS
INTRAVENOUS | Status: AC
  Filled 2020-06-02: qty 20

## 2020-06-02 MED ORDER — KCL-LACTATED RINGERS-D5W 20 MEQ/L IV SOLN
INTRAVENOUS | Status: AC
  Filled 2020-06-02 (×2): qty 1000

## 2020-06-02 MED ORDER — HEMOSTATIC AGENTS (NO CHARGE) OPTIME
TOPICAL | Status: DC | PRN
  Administered 2020-06-02: 1 via TOPICAL

## 2020-06-02 MED ORDER — DIPHENHYDRAMINE HCL 50 MG/ML IJ SOLN: 12.5000 mg | Freq: Four times a day (QID) | INTRAMUSCULAR | Status: DC | PRN

## 2020-06-02 MED ORDER — ALUM & MAG HYDROXIDE-SIMETH 200-200-20 MG/5ML PO SUSP
30.0000 mL | ORAL | Status: DC | PRN
  Filled 2020-06-02: qty 30

## 2020-06-02 MED ORDER — LIDOCAINE-EPINEPHRINE 1 %-1:100000 IJ SOLN
INTRAMUSCULAR | Status: AC
  Filled 2020-06-02: qty 1

## 2020-06-02 MED ORDER — LACTATED RINGERS IV SOLN: INTRAVENOUS | Status: DC

## 2020-06-02 MED ORDER — METHOCARBAMOL 500 MG PO TABS
500.0000 mg | ORAL_TABLET | Freq: Four times a day (QID) | ORAL | Status: DC | PRN
  Administered 2020-06-03: 500 mg via ORAL
  Filled 2020-06-02: qty 1

## 2020-06-02 MED ORDER — ORAL CARE MOUTH RINSE: 15.0000 mL | Freq: Once | OROMUCOSAL | Status: AC

## 2020-06-02 MED ORDER — IBUPROFEN 600 MG PO TABS
600.0000 mg | ORAL_TABLET | Freq: Four times a day (QID) | ORAL | Status: DC | PRN
  Administered 2020-06-02 – 2020-06-03 (×2): 600 mg via ORAL
  Filled 2020-06-02 (×2): qty 1

## 2020-06-02 MED ORDER — LIDOCAINE 2% (20 MG/ML) 5 ML SYRINGE
INTRAMUSCULAR | Status: DC | PRN
  Administered 2020-06-02: 60 mg via INTRAVENOUS

## 2020-06-02 MED ORDER — ROCURONIUM BROMIDE 10 MG/ML (PF) SYRINGE
PREFILLED_SYRINGE | INTRAVENOUS | Status: DC | PRN
  Administered 2020-06-02: 50 mg via INTRAVENOUS
  Administered 2020-06-02: 10 mg via INTRAVENOUS
  Administered 2020-06-02: 50 mg via INTRAVENOUS
  Administered 2020-06-02: 20 mg via INTRAVENOUS

## 2020-06-02 MED ORDER — PHENYLEPHRINE 40 MCG/ML (10ML) SYRINGE FOR IV PUSH (FOR BLOOD PRESSURE SUPPORT)
PREFILLED_SYRINGE | INTRAVENOUS | Status: AC
  Filled 2020-06-02: qty 10

## 2020-06-02 MED ORDER — CHLORHEXIDINE GLUCONATE 0.12 % MT SOLN
15.0000 mL | Freq: Once | OROMUCOSAL | Status: AC
  Administered 2020-06-02: 15 mL via OROMUCOSAL
  Filled 2020-06-02: qty 15

## 2020-06-02 MED ORDER — SENNA 8.6 MG PO TABS
1.0000 | ORAL_TABLET | Freq: Two times a day (BID) | ORAL | Status: DC
  Administered 2020-06-02: 8.6 mg via ORAL
  Filled 2020-06-02 (×2): qty 1

## 2020-06-02 MED ORDER — CEFAZOLIN SODIUM-DEXTROSE 2-4 GM/100ML-% IV SOLN
2.0000 g | INTRAVENOUS | Status: AC
  Administered 2020-06-02: 2 g via INTRAVENOUS
  Filled 2020-06-02: qty 100

## 2020-06-02 MED ORDER — TRAMADOL HCL 50 MG PO TABS
50.0000 mg | ORAL_TABLET | Freq: Four times a day (QID) | ORAL | Status: DC | PRN
  Administered 2020-06-02 – 2020-06-03 (×3): 50 mg via ORAL
  Filled 2020-06-02 (×3): qty 1

## 2020-06-02 MED ORDER — ACETAMINOPHEN 650 MG RE SUPP: 650.0000 mg | Freq: Four times a day (QID) | RECTAL | Status: DC | PRN

## 2020-06-02 MED ORDER — PHENYLEPHRINE HCL (PRESSORS) 10 MG/ML IV SOLN
INTRAVENOUS | Status: DC | PRN
  Administered 2020-06-02 (×3): 100 ug via INTRAVENOUS

## 2020-06-02 MED ORDER — BUPIVACAINE HCL (PF) 0.25 % IJ SOLN
INTRAMUSCULAR | Status: DC | PRN
  Administered 2020-06-02: 4 mL

## 2020-06-02 MED ORDER — ALBUMIN HUMAN 5 % IV SOLN: INTRAVENOUS | Status: DC | PRN

## 2020-06-02 MED ORDER — PROCHLORPERAZINE MALEATE 10 MG PO TABS
10.0000 mg | ORAL_TABLET | Freq: Four times a day (QID) | ORAL | Status: DC | PRN
  Filled 2020-06-02: qty 1

## 2020-06-02 MED ORDER — DEXAMETHASONE SODIUM PHOSPHATE 10 MG/ML IJ SOLN
INTRAMUSCULAR | Status: AC
  Filled 2020-06-02: qty 1

## 2020-06-02 MED ORDER — BUPIVACAINE HCL (PF) 0.25 % IJ SOLN
INTRAMUSCULAR | Status: AC
  Filled 2020-06-02: qty 20

## 2020-06-02 MED ORDER — DEXAMETHASONE SODIUM PHOSPHATE 10 MG/ML IJ SOLN
INTRAMUSCULAR | Status: DC | PRN
  Administered 2020-06-02: 5 mg via INTRAVENOUS

## 2020-06-02 MED ORDER — HYDROMORPHONE HCL 1 MG/ML IJ SOLN
0.5000 mg | INTRAMUSCULAR | Status: DC | PRN
  Administered 2020-06-02 – 2020-06-03 (×3): 0.5 mg via INTRAVENOUS
  Filled 2020-06-02 (×3): qty 1

## 2020-06-02 MED ORDER — DIPHENHYDRAMINE HCL 12.5 MG/5ML PO ELIX: 12.5000 mg | ORAL_SOLUTION | Freq: Four times a day (QID) | ORAL | Status: DC | PRN

## 2020-06-02 MED ORDER — EZETIMIBE 10 MG PO TABS
10.0000 mg | ORAL_TABLET | Freq: Every day | ORAL | Status: DC
  Filled 2020-06-02: qty 1

## 2020-06-02 MED ORDER — OXYCODONE HCL 5 MG PO TABS
ORAL_TABLET | ORAL | Status: AC
  Filled 2020-06-02: qty 2

## 2020-06-02 SURGICAL SUPPLY — 86 items
APPLICATOR LAPAROSCOPIC HEMOBL (MISCELLANEOUS) ×3 IMPLANT
BLADE EXTENDED COATED 6.5IN (ELECTRODE) IMPLANT
CANNULA REDUC XI 12-8 STAPL (CANNULA) ×3
CANNULA REDUCER 12-8 DVNC XI (CANNULA) ×2 IMPLANT
CHLORAPREP W/TINT 26 (MISCELLANEOUS) ×3 IMPLANT
CLIP LIGATING HEMO O LOK GREEN (MISCELLANEOUS) ×6 IMPLANT
CLIP VESOLOCK LG 6/CT PURPLE (CLIP) IMPLANT
CLIP VESOLOCK MED 6/CT (CLIP) IMPLANT
COVER MAYO STAND STRL (DRAPES) ×3 IMPLANT
COVER SURGICAL LIGHT HANDLE (MISCELLANEOUS) ×3 IMPLANT
COVER TIP SHEARS 8 DVNC (MISCELLANEOUS) IMPLANT
COVER TIP SHEARS 8MM DA VINCI (MISCELLANEOUS)
COVER WAND RF STERILE (DRAPES) IMPLANT
DECANTER SPIKE VIAL GLASS SM (MISCELLANEOUS) ×3 IMPLANT
DEFOGGER SCOPE WARMER CLEARIFY (MISCELLANEOUS) ×3 IMPLANT
DERMABOND ADVANCED (GAUZE/BANDAGES/DRESSINGS) ×1
DERMABOND ADVANCED .7 DNX12 (GAUZE/BANDAGES/DRESSINGS) ×2 IMPLANT
DEVICE TROCAR PUNCTURE CLOSURE (ENDOMECHANICALS) ×3 IMPLANT
DRAIN CHANNEL 19F RND (DRAIN) IMPLANT
DRAPE ARM DVNC X/XI (DISPOSABLE) ×8 IMPLANT
DRAPE COLUMN DVNC XI (DISPOSABLE) ×2 IMPLANT
DRAPE CV SPLIT W-CLR ANES SCRN (DRAPES) IMPLANT
DRAPE DA VINCI XI ARM (DISPOSABLE) ×12
DRAPE DA VINCI XI COLUMN (DISPOSABLE) ×3
DRAPE ORTHO SPLIT 77X108 STRL (DRAPES) ×3
DRAPE SURG ORHT 6 SPLT 77X108 (DRAPES) ×2 IMPLANT
ELECT CAUTERY BLADE 6.4 (BLADE) IMPLANT
ELECT REM PT RETURN 9FT ADLT (ELECTROSURGICAL) ×3
ELECTRODE REM PT RTRN 9FT ADLT (ELECTROSURGICAL) ×2 IMPLANT
ENDOLOOP SUT PDS II  0 18 (SUTURE)
ENDOLOOP SUT PDS II 0 18 (SUTURE) IMPLANT
EVACUATOR SILICONE 100CC (DRAIN) IMPLANT
GAUZE SPONGE 4X4 12PLY STRL (GAUZE/BANDAGES/DRESSINGS) IMPLANT
GLOVE BIO SURGEON STRL SZ 6 (GLOVE) ×9 IMPLANT
GLOVE INDICATOR 6.5 STRL GRN (GLOVE) ×9 IMPLANT
GOWN STRL REUS W/ TWL LRG LVL3 (GOWN DISPOSABLE) ×4 IMPLANT
GOWN STRL REUS W/ TWL XL LVL3 (GOWN DISPOSABLE) IMPLANT
GOWN STRL REUS W/TWL 2XL LVL3 (GOWN DISPOSABLE) ×9 IMPLANT
GOWN STRL REUS W/TWL LRG LVL3 (GOWN DISPOSABLE) ×6
GOWN STRL REUS W/TWL XL LVL3 (GOWN DISPOSABLE)
HEMOSTAT HEMOBLAST BELLOWS (HEMOSTASIS) ×3 IMPLANT
KIT BASIN OR (CUSTOM PROCEDURE TRAY) ×3 IMPLANT
KIT TURNOVER KIT B (KITS) IMPLANT
L-HOOK LAP DISP 36CM (ELECTROSURGICAL) ×3
LHOOK LAP DISP 36CM (ELECTROSURGICAL) ×2 IMPLANT
NEEDLE HYPO 22GX1.5 SAFETY (NEEDLE) ×3 IMPLANT
NEEDLE INSUFFLATION 14GA 120MM (NEEDLE) ×3 IMPLANT
OBTURATOR OPTICAL STANDARD 8MM (TROCAR) ×3
OBTURATOR OPTICAL STND 8 DVNC (TROCAR) ×2
OBTURATOR OPTICALSTD 8 DVNC (TROCAR) ×2 IMPLANT
PAD ARMBOARD 7.5X6 YLW CONV (MISCELLANEOUS) ×6 IMPLANT
PENCIL SMOKE EVACUATOR (MISCELLANEOUS) ×3 IMPLANT
POUCH LAPAROSCOPIC INSTRUMENT (MISCELLANEOUS) ×3 IMPLANT
POUCH RETRIEVAL ECOSAC 10 (ENDOMECHANICALS) ×4 IMPLANT
POUCH RETRIEVAL ECOSAC 10MM (ENDOMECHANICALS) ×6
POUCH SPECIMEN RETRIEVAL 10MM (ENDOMECHANICALS) ×3 IMPLANT
SCISSORS LAP 5X35 DISP (ENDOMECHANICALS) IMPLANT
SEAL CANN UNIV 5-8 DVNC XI (MISCELLANEOUS) ×6 IMPLANT
SEAL XI 5MM-8MM UNIVERSAL (MISCELLANEOUS) ×9
SEALER VESSEL DA VINCI XI (MISCELLANEOUS) ×3
SEALER VESSEL EXT DVNC XI (MISCELLANEOUS) ×2 IMPLANT
SET IRRIG TUBING LAPAROSCOPIC (IRRIGATION / IRRIGATOR) ×6 IMPLANT
SET TUBE SMOKE EVAC HIGH FLOW (TUBING) ×3 IMPLANT
SLEEVE XCEL OPT CAN 5 100 (ENDOMECHANICALS) IMPLANT
SOLUTION ELECTROLUBE (MISCELLANEOUS) ×3 IMPLANT
SPONGE LAP 18X18 RF (DISPOSABLE) IMPLANT
STAPLER 60 DA VINCI SURE FORM (STAPLE) ×3
STAPLER 60 SUREFORM DVNC (STAPLE) ×2 IMPLANT
STAPLER CANNULA SEAL DVNC XI (STAPLE) IMPLANT
STAPLER CANNULA SEAL XI (STAPLE)
STAPLER VISISTAT 35W (STAPLE) ×3 IMPLANT
STOPCOCK 4 WAY LG BORE MALE ST (IV SETS) ×3 IMPLANT
SUT ETHILON 2 0 FS 18 (SUTURE) IMPLANT
SUT MNCRL AB 4-0 PS2 18 (SUTURE) ×3 IMPLANT
SUT SILK 2 0 (SUTURE)
SUT SILK 2 0 SH CR/8 (SUTURE) IMPLANT
SUT SILK 2-0 18XBRD TIE 12 (SUTURE) IMPLANT
SUT SILK 3 0 (SUTURE)
SUT SILK 3 0 SH CR/8 (SUTURE) IMPLANT
SUT SILK 3-0 18XBRD TIE 12 (SUTURE) IMPLANT
SUT VICRYL 0 TIES 12 18 (SUTURE) IMPLANT
TOWEL GREEN STERILE FF (TOWEL DISPOSABLE) ×3 IMPLANT
TRAY FOLEY MTR SLVR 16FR STAT (SET/KITS/TRAYS/PACK) IMPLANT
TRAY LAPAROSCOPIC MC (CUSTOM PROCEDURE TRAY) ×3 IMPLANT
TROCAR ADV FIXATION 5X100MM (TROCAR) ×3 IMPLANT
TROCAR XCEL NON-BLD 5MMX100MML (ENDOMECHANICALS) IMPLANT

## 2020-06-02 NOTE — Interval H&P Note (Signed)
History and Physical Interval Note:  06/02/2020 7:33 AM  Krista Carney  has presented today for surgery, with the diagnosis of liver cyst  right lower quadrant abdominal pain.  The various methods of treatment have been discussed with the patient and family. After consideration of risks, benefits and other options for treatment, the patient has consented to  Procedure(s): POSSIBLE XI ROBOTIC ASSISTED LAPAROSCOPIC CHOLECYSTECTOMY, ROBOTIC LIVER CYST UNROOFING (N/A) POSSIBLE SPIGELIAN HERNIA REPAIR (N/A) as a surgical intervention.  The patient's history has been reviewed, patient examined, no change in status, stable for surgery.  I have reviewed the patient's chart and labs.  Questions were answered to the patient's satisfaction.     Stark Klein

## 2020-06-02 NOTE — Anesthesia Procedure Notes (Addendum)
Procedure Name: Intubation Date/Time: 06/02/2020 7:52 AM Performed by: Terrence Dupont, RN Pre-anesthesia Checklist: Patient identified, Emergency Drugs available, Suction available and Patient being monitored Patient Re-evaluated:Patient Re-evaluated prior to induction Oxygen Delivery Method: Circle system utilized Preoxygenation: Pre-oxygenation with 100% oxygen Induction Type: IV induction Ventilation: Mask ventilation without difficulty Laryngoscope Size: Mac and 3 Grade View: Grade I Tube type: Oral Tube size: 7.0 mm Number of attempts: 1 Airway Equipment and Method: Stylet Placement Confirmation: ETT inserted through vocal cords under direct vision,  positive ETCO2 and breath sounds checked- equal and bilateral Secured at: 22 cm Tube secured with: Tape Dental Injury: Teeth and Oropharynx as per pre-operative assessment

## 2020-06-02 NOTE — Transfer of Care (Signed)
Immediate Anesthesia Transfer of Care Note  Patient: Krista Carney  Procedure(s) Performed: XI ROBOTIC ASSISTED LAPAROSCOPIC CHOLECYSTECTOMY AND ROBOTIC LIVER CYST UNROOFING (N/A Abdomen)  Patient Location: PACU  Anesthesia Type:General  Level of Consciousness: drowsy  Airway & Oxygen Therapy: Patient Spontanous Breathing  Post-op Assessment: Report given to RN and Post -op Vital signs reviewed and stable  Post vital signs: Reviewed and stable  Last Vitals:  Vitals Value Taken Time  BP 144/71 06/02/20 1056  Temp    Pulse 85 06/02/20 1058  Resp 22 06/02/20 1058  SpO2 96 % 06/02/20 1058  Vitals shown include unvalidated device data.  Last Pain:  Vitals:   06/02/20 0605  TempSrc:   PainSc: 3       Patients Stated Pain Goal: 3 (37/00/52 5910)  Complications: No complications documented.

## 2020-06-02 NOTE — Anesthesia Postprocedure Evaluation (Signed)
Anesthesia Post Note  Patient: Krista Carney  Procedure(s) Performed: XI ROBOTIC ASSISTED LAPAROSCOPIC CHOLECYSTECTOMY AND ROBOTIC LIVER CYST UNROOFING (N/A Abdomen)     Patient location during evaluation: PACU Anesthesia Type: General Level of consciousness: awake and alert Pain management: pain level controlled Vital Signs Assessment: post-procedure vital signs reviewed and stable Respiratory status: spontaneous breathing, nonlabored ventilation, respiratory function stable and patient connected to nasal cannula oxygen Cardiovascular status: blood pressure returned to baseline and stable Postop Assessment: no apparent nausea or vomiting Anesthetic complications: no   No complications documented.  Last Vitals:  Vitals:   06/02/20 1326 06/02/20 1404  BP: (!) 159/74 (!) 153/72  Pulse: 89 86  Resp: 15 16  Temp: (!) 36.3 C 36.9 C  SpO2: 94% 96%    Last Pain:  Vitals:   06/02/20 1605  TempSrc:   PainSc: 10-Worst pain ever                 Tiajuana Amass

## 2020-06-02 NOTE — Op Note (Signed)
PRE-OPERATIVE DIAGNOSIS: Right mid/upper quadrant pain, right liver cyst  POST-OPERATIVE DIAGNOSIS:  Same  PROCEDURE:  Procedure(s): Robotic liver cyst unroofing, robotic cholecystectomy  SURGEON:  Surgeon(s): Stark Klein, MD  ASSIST: Judyann Munson, RNFA  ANESTHESIA:   local and general  DRAINS: none   LOCAL MEDICATIONS USED:  OTHER exparel and marcaine  SPECIMEN:  Source of Specimen:  liver with liver cyst and gallbladder  DISPOSITION OF SPECIMEN:  PATHOLOGY  COUNTS:  YES  DICTATION: .Dragon Dictation  PLAN OF CARE: Admit for overnight observation  PATIENT DISPOSITION:  PACU - hemodynamically stable.  FINDINGS:  Liver cyst with large loculation, gallbladder with mild chronic inflammation, no evidence of abdominal wall hernia  EBL: <50 mL  PROCEDURE:  Patient was identified in the holding area and then taken operating room where she was placed supine on operating room table.  General endotracheal anesthesia was induced.  Her arms were tucked, and the abdomen was then prepped and draped in sterile fashion.  A timeout was performed according to the surgical safety checklist.  When all was correct, we continued.  The patient was placed in reverse Trendelenburg position and rotated to the right.  A small stab incision was made with a #11 blade and a Veress needle was inserted through the multiple layers of the abdominal wall.  This was tested with saline and saline flowed easily through the needle.  Pneumoperitoneum was achieved to a pressure of 15 mmHg.  Robotic trochars were placed in an arc formation.  An 8 millimeter left upper quadrant incision was placed.  A 12 mm left mid abdominal port was placed.  An 8 millimeter port was placed in the left lower quadrant as well as 1 in the right lower quadrant.  An assistant port was placed in the lower right midabdomen.  The robot was docked.   The patient was then placed into more reverse trendelenburg position and rotated to the  left.  The tip up fenestrated grasper was used to elevate the liver.  The vessel sealer was used to open the liver parenchyma near the cyst.  The cyst was opened and suction out.  The fenestrated bipolar was also used for holding.  The vessel sealer was used to take the peripheral liver cyst wall approximately 7/8 of the volume of the cyst.  The cyst wall was then detached with the vessel sealer completely.  Attention was then directed to the gallbladder.  There was chronic inflammation present, wall the gallbladder as well as injection.  The gallbladder was grasped at the fundus and elevated toward the head.  The cystic duct and cystic artery were skeletonized easily.  Locking Weck clips were used to triply clipped the cystic duct on the proximal aspect and doubly clipped the cystic artery on the proximal aspect.  Both structures were clipped on the specimen side.  The cautery was used to divide the duct and artery.  This was taken down off the gallbladder fossa easily.  The liver cyst wall was reexamined and the portion of the liver on the lateral aspect of the gallbladder fossa was falling down onto the posterior cyst wall.  This anatomy with concern for recurrence.  An additional portion of the liver was taken off to make the posterior wall not collapsed on itself to minimize risk of recurrence. The vessel sealer was used to do this. The hook cautery was used to achieve hemostasis in the liver bed.  The robot was then undocked.  The Endo Catch bag was  used to retrieve the gallbladder and the liver cyst wall.  An Ecosac was used to remove the small segment of liver adjacent to the gallbladder.  Hemoblast was then placed over the liver parenchymal cut surface.  A moist Ray-Tec was placed over the hemoblast.  The Endo Close was then used to close the 12 mm port.  There was no residual palpable fascial defect and no air leakage from this port.  The Ray-Tec was then removed from the right upper quadrant and the  liver surface was examined.  There was no evidence of bleeding.    A four-quadrant inspection was performed and there was no evidence of bleeding, gross pathology, or succus pooling in the abdomen.  The abdominal wall along the border of the rectus on the right was examined carefully and there was no evidence of any hernia.  The remaining trochars were removed after the pneumoperitoneum was allowed to evacuate.  The skin of all the incisions was closed using 4-0 Monocryl subcuticular sutures.  The wounds were then cleaned, dried, and dressed with Dermabond.  Patient was allowed to emerge from anesthesia and taken to the PACU in stable condition.  Needle, sponge, and instrument counts were correct x2.

## 2020-06-02 NOTE — Discharge Instructions (Addendum)
Take stool softeners twice daily (senna or senekot or colace, drugstore brand OK).    For pain, stay on tylenol and ibuprofen three times per day for a least three days.  Use the tramadol and robaxin first, then if still in pain, add oxycodone.    TAKE IBUPROFEN WITH FOOD.    If you become bloated, go back to full liquid diet and add miralax.        [place] Office Phone Number [phone]   POST OP INSTRUCTIONS  Always review your discharge instruction sheet given to you by the facility where your surgery was performed.  IF YOU HAVE DISABILITY OR FAMILY LEAVE FORMS, YOU MUST BRING THEM TO THE OFFICE FOR PROCESSING.  DO NOT GIVE THEM TO YOUR DOCTOR.  1. A prescription for pain medication may be given to you upon discharge.  Take your pain medication as prescribed, if needed.  If narcotic pain medicine is not needed, then you may take acetaminophen (Tylenol) or ibuprofen (Advil) as needed. 2. Take your usually prescribed medications unless otherwise directed 3. If you need a refill on your pain medication, please contact your pharmacy.  They will contact our office to request authorization.  Prescriptions will not be filled after 5pm or on week-ends. 4. You should eat very light the first 24 hours after surgery, such as soup, crackers, pudding, etc.  Resume your normal diet the day after surgery 5. It is common to experience some constipation if taking pain medication after surgery.  Increasing fluid intake and taking a stool softener will usually help or prevent this problem from occurring.  A mild laxative (Milk of Magnesia or Miralax) should be taken according to package directions if there are no bowel movements after 48 hours. 6. You may shower in 48 hours.  The surgical glue will flake off in 2-3 weeks.   7. ACTIVITIES:  No strenuous activity or heavy lifting for 2 weeks. a. You may drive when you no longer are taking prescription pain medication, you can  comfortably wear a seatbelt, and you can safely maneuver your car and apply brakes. b. RETURN TO WORK:  __________1-2 weeks or as tolerated if no lifting.  _______________ Dennis Bast should see your doctor in the office for a follow-up appointment approximately three-four weeks after your surgery.    WHEN TO CALL YOUR DOCTOR: 1. Fever over 101.0 2. Nausea and/or vomiting. 3. Extreme swelling or bruising. 4. Continued bleeding from incision. 5. Increased pain, redness, or drainage from the incision.  The clinic staff is available to answer your questions during regular business hours.  Please don't hesitate to call and ask to speak to one of the nurses for clinical concerns.  If you have a medical emergency, go to the nearest emergency room or call 911.  A surgeon from [facility] is always on call at the hospital.  For further questions, please visit [email or web address]

## 2020-06-03 DIAGNOSIS — K7689 Other specified diseases of liver: Secondary | ICD-10-CM | POA: Diagnosis not present

## 2020-06-03 LAB — COMPREHENSIVE METABOLIC PANEL
ALT: 122 U/L — ABNORMAL HIGH (ref 0–44)
AST: 146 U/L — ABNORMAL HIGH (ref 15–41)
Albumin: 3.3 g/dL — ABNORMAL LOW (ref 3.5–5.0)
Alkaline Phosphatase: 66 U/L (ref 38–126)
Anion gap: 9 (ref 5–15)
BUN: 7 mg/dL — ABNORMAL LOW (ref 8–23)
CO2: 26 mmol/L (ref 22–32)
Calcium: 8.2 mg/dL — ABNORMAL LOW (ref 8.9–10.3)
Chloride: 96 mmol/L — ABNORMAL LOW (ref 98–111)
Creatinine, Ser: 0.66 mg/dL (ref 0.44–1.00)
GFR, Estimated: >60 mL/min (ref >60)
Glucose, Bld: 138 mg/dL — ABNORMAL HIGH (ref 70–99)
Potassium: 4.1 mmol/L (ref 3.5–5.1)
Sodium: 131 mmol/L — ABNORMAL LOW (ref 135–145)
Total Bilirubin: 0.7 mg/dL (ref 0.3–1.2)
Total Protein: 5.3 g/dL — ABNORMAL LOW (ref 6.5–8.1)

## 2020-06-03 LAB — CBC
HCT: 33.4 % — ABNORMAL LOW (ref 36.0–46.0)
Hemoglobin: 11.1 g/dL — ABNORMAL LOW (ref 12.0–15.0)
MCH: 30.9 pg (ref 26.0–34.0)
MCHC: 33.2 g/dL (ref 30.0–36.0)
MCV: 93 fL (ref 80.0–100.0)
Platelets: 217 10*3/uL (ref 150–400)
RBC: 3.59 MIL/uL — ABNORMAL LOW (ref 3.87–5.11)
RDW: 11.9 % (ref 11.5–15.5)
WBC: 10.8 10*3/uL — ABNORMAL HIGH (ref 4.0–10.5)
nRBC: 0 % (ref 0.0–0.2)

## 2020-06-03 LAB — SURGICAL PATHOLOGY

## 2020-06-03 MED ORDER — IBUPROFEN 600 MG PO TABS: 600.0000 mg | ORAL_TABLET | Freq: Four times a day (QID) | ORAL | 0 refills | Status: AC | PRN

## 2020-06-03 MED ORDER — PROCHLORPERAZINE MALEATE 10 MG PO TABS: 10.0000 mg | ORAL_TABLET | Freq: Four times a day (QID) | ORAL | 0 refills | Status: DC | PRN

## 2020-06-03 MED ORDER — OXYCODONE HCL 5 MG PO TABS: 5.0000 mg | ORAL_TABLET | ORAL | 0 refills | Status: DC | PRN

## 2020-06-03 MED ORDER — TRAMADOL HCL 50 MG PO TABS
50.0000 mg | ORAL_TABLET | Freq: Four times a day (QID) | ORAL | 0 refills | Status: DC | PRN
Start: 2020-06-03 — End: 2023-04-25

## 2020-06-03 MED ORDER — METHOCARBAMOL 500 MG PO TABS: 500.0000 mg | ORAL_TABLET | Freq: Four times a day (QID) | ORAL | 0 refills | Status: DC | PRN

## 2020-06-03 MED ORDER — SENNA 8.6 MG PO TABS: 1.0000 | ORAL_TABLET | Freq: Two times a day (BID) | ORAL | 0 refills | Status: AC

## 2020-06-03 NOTE — Progress Notes (Signed)
Nsg Discharge Note  Admit Date:  06/02/2020 Discharge date: 06/03/2020   DHRITI FALES to be D/C'd home per MD order.  AVS completed.  Copy for chart, and copy for patient signed, and dated. Patient/caregiver able to verbalize understanding.  Discharge Medication: Allergies as of 06/03/2020      Reactions   Cheratussin Ac [guaifenesin-codeine] Hives   Morphine And Related    Pt immune to morphine drip      Medication List    TAKE these medications   aspirin EC 81 MG tablet Take 81 mg by mouth 2 (two) times a week.   butalbital-acetaminophen-caffeine 50-325-40 MG tablet Commonly known as: FIORICET Take 1 tablet by mouth 2 (two) times daily as needed for headache.   cetirizine 10 MG tablet Commonly known as: ZYRTEC Take 10 mg by mouth daily.   cholecalciferol 25 MCG (1000 UNIT) tablet Commonly known as: VITAMIN D3 Take 1,000 Units by mouth daily.   clobetasol ointment 0.05 % Commonly known as: TEMOVATE Apply 1 application topically 2 (two) times daily.   CO Q 10 PO Take 1 capsule by mouth daily.   ezetimibe 10 MG tablet Commonly known as: ZETIA Take 10 mg by mouth daily.   fluticasone 50 MCG/ACT nasal spray Commonly known as: FLONASE Place 1 spray into both nostrils daily as needed for allergies or rhinitis.   ibuprofen 600 MG tablet Commonly known as: ADVIL Take 1 tablet (600 mg total) by mouth every 6 (six) hours as needed (for mild pain not relieved by other medications.).   MAGNESIUM PO Take 1 tablet by mouth daily.   methocarbamol 500 MG tablet Commonly known as: ROBAXIN Take 1 tablet (500 mg total) by mouth every 6 (six) hours as needed for muscle spasms.   oxyCODONE 5 MG immediate release tablet Commonly known as: Oxy IR/ROXICODONE Take 1-2 tablets (5-10 mg total) by mouth every 4 (four) hours as needed for moderate pain.   prochlorperazine 10 MG tablet Commonly known as: COMPAZINE Take 1 tablet (10 mg total) by mouth every 6 (six) hours as  needed for nausea or vomiting (Use for nausea and / or vomiting unresolved with ondansetron (Zofran).).   senna 8.6 MG Tabs tablet Commonly known as: SENOKOT Take 1 tablet (8.6 mg total) by mouth 2 (two) times daily.   Ultram 50 MG tablet Generic drug: traMADol Take 50 mg by mouth every 6 (six) hours as needed. What changed: Another medication with the same name was added. Make sure you understand how and when to take each.   traMADol 50 MG tablet Commonly known as: ULTRAM Take 1 tablet (50 mg total) by mouth every 6 (six) hours as needed for moderate pain. What changed: You were already taking a medication with the same name, and this prescription was added. Make sure you understand how and when to take each.   vitamin C 1000 MG tablet Take 1,000 mg by mouth daily.   zolpidem 10 MG tablet Commonly known as: AMBIEN Take 5 mg by mouth at bedtime as needed for sleep.       Discharge Assessment: Vitals:   06/03/20 0504 06/03/20 1000  BP: (!) 143/72 (!) 109/59  Pulse: 77 72  Resp: 18 18  Temp: 98 F (36.7 C) 97.7 F (36.5 C)  SpO2: 92% 94%   Skin clean, dry and intact without evidence of skin break down, no evidence of skin tears noted. IV catheter discontinued intact. Site without signs and symptoms of complications - no redness or edema  noted at insertion site, patient denies c/o pain - only slight tenderness at site.  Dressing with slight pressure applied.  D/c Instructions-Education: Discharge instructions given to patient/family with verbalized understanding. D/c education completed with patient/family including follow up instructions, medication list, d/c activities limitations if indicated, with other d/c instructions as indicated by MD - patient able to verbalize understanding, all questions fully answered. Patient instructed to return to ED, call 911, or call MD for any changes in condition.  Patient escorted via [place], and D/C home via private auto.  Atilano Ina,  RN 06/03/2020 12:19 PM

## 2020-06-03 NOTE — Discharge Summary (Addendum)
Physician Discharge Summary  Patient ID: Krista Carney MRN: [id number] DOB/AGE: 1946/04/11 73 y.o.  Admit date: 06/02/2020 Discharge date: 06/03/2020  Admission Diagnoses: Patient Active Problem List   Diagnosis Date Noted  . Liver cyst 06/02/2020  . Lichen sclerosus et atrophicus of the vulva 07/25/2013  . Breast cancer in situ 07/25/2013  . Unspecified vitamin D deficiency 07/25/2013    Discharge Diagnoses:  Active Problems:   Liver cyst Mild chronic cholecystitis Mild hyponatremia Acute blood loss anemia   Discharged Condition: stable  Hospital Course:  Patient was admitted to the floor after robotic liver cyst unroofing and cholecystectomy 06/02/2020.  She had a rough night with pain, but this has subsided quite a bit this AM.  She is up moving around independently and brushing her teeth.  Her main complaint is pain at the larger incision which was the extraction site.  She denies n/v and has tolerated soft diet.    She was educated about taking home incentive spirometer and how to manage constipation.    Consults: None  Significant Diagnostic Studies: labs: HCT 33.4, Cr 0.66, Na 131 at d/c.    Treatments: surgery: see above  Discharge Exam: Blood pressure (!) 143/72, pulse 77, temperature 98 F (36.7 C), temperature source Oral, resp. rate 18, height 5' 4.5" (1.638 m), weight 65.8 kg, last menstrual period 10/07/1987, SpO2 92 %. General appearance: alert, cooperative and no distress Resp: breathing comfortably Cardio: regular rate and rhythm GI: soft, approp tender at the larger port sites, faint bruising at the extraction port. Extremities: extremities normal, atraumatic, no cyanosis or edema  Skin - no rash or hives after dilaudid and oxycodone.    Disposition:    Allergies as of 06/03/2020      Reactions   Cheratussin Ac [guaifenesin-codeine] Hives   Morphine And Related    Pt immune to morphine drip      Medication List    TAKE these  medications   aspirin EC 81 MG tablet Take 81 mg by mouth 2 (two) times a week.   butalbital-acetaminophen-caffeine 50-325-40 MG tablet Commonly known as: FIORICET Take 1 tablet by mouth 2 (two) times daily as needed for headache.   cetirizine 10 MG tablet Commonly known as: ZYRTEC Take 10 mg by mouth daily.   cholecalciferol 25 MCG (1000 UNIT) tablet Commonly known as: VITAMIN D3 Take 1,000 Units by mouth daily.   clobetasol ointment 0.05 % Commonly known as: TEMOVATE Apply 1 application topically 2 (two) times daily.   CO Q 10 PO Take 1 capsule by mouth daily.   ezetimibe 10 MG tablet Commonly known as: ZETIA Take 10 mg by mouth daily.   fluticasone 50 MCG/ACT nasal spray Commonly known as: FLONASE Place 1 spray into both nostrils daily as needed for allergies or rhinitis.   ibuprofen 600 MG tablet Commonly known as: ADVIL Take 1 tablet (600 mg total) by mouth every 6 (six) hours as needed (for mild pain not relieved by other medications.).   MAGNESIUM PO Take 1 tablet by mouth daily.   methocarbamol 500 MG tablet Commonly known as: ROBAXIN Take 1 tablet (500 mg total) by mouth every 6 (six) hours as needed for muscle spasms.   oxyCODONE 5 MG immediate release tablet Commonly known as: Oxy IR/ROXICODONE Take 1-2 tablets (5-10 mg total) by mouth every 4 (four) hours as needed for moderate pain.   prochlorperazine 10 MG tablet Commonly known as: COMPAZINE Take 1 tablet (10 mg total) by mouth every 6 (six) hours  as needed for nausea or vomiting (Use for nausea and / or vomiting unresolved with ondansetron (Zofran).).   senna 8.6 MG Tabs tablet Commonly known as: SENOKOT Take 1 tablet (8.6 mg total) by mouth 2 (two) times daily.   Ultram 50 MG tablet Generic drug: traMADol Take 50 mg by mouth every 6 (six) hours as needed. What changed: Another medication with the same name was added. Make sure you understand how and when to take each.   traMADol 50 MG  tablet Commonly known as: ULTRAM Take 1 tablet (50 mg total) by mouth every 6 (six) hours as needed for moderate pain. What changed: You were already taking a medication with the same name, and this prescription was added. Make sure you understand how and when to take each.   vitamin C 1000 MG tablet Take 1,000 mg by mouth daily.   zolpidem 10 MG tablet Commonly known as: AMBIEN Take 5 mg by mouth at bedtime as needed for sleep.       Follow-up Information    Stark Klein, MD In 2 weeks.   Specialty: General Surgery Contact information: [address] [place] [address] [phone]               Signed: Stark Klein 06/03/2020, 7:47 AM

## 2020-09-25 DIAGNOSIS — H25811 Combined forms of age-related cataract, right eye: Secondary | ICD-10-CM | POA: Diagnosis not present

## 2020-09-25 DIAGNOSIS — H25812 Combined forms of age-related cataract, left eye: Secondary | ICD-10-CM | POA: Diagnosis not present

## 2020-10-29 DIAGNOSIS — H52223 Regular astigmatism, bilateral: Secondary | ICD-10-CM | POA: Diagnosis not present

## 2020-10-29 DIAGNOSIS — H2512 Age-related nuclear cataract, left eye: Secondary | ICD-10-CM | POA: Diagnosis not present

## 2020-10-29 DIAGNOSIS — H25812 Combined forms of age-related cataract, left eye: Secondary | ICD-10-CM | POA: Diagnosis not present

## 2020-10-29 DIAGNOSIS — Z01818 Encounter for other preprocedural examination: Secondary | ICD-10-CM | POA: Diagnosis not present

## 2020-11-10 DIAGNOSIS — L814 Other melanin hyperpigmentation: Secondary | ICD-10-CM | POA: Diagnosis not present

## 2020-11-10 DIAGNOSIS — Z808 Family history of malignant neoplasm of other organs or systems: Secondary | ICD-10-CM | POA: Diagnosis not present

## 2020-11-10 DIAGNOSIS — C50912 Malignant neoplasm of unspecified site of left female breast: Secondary | ICD-10-CM | POA: Diagnosis not present

## 2020-11-10 DIAGNOSIS — D225 Melanocytic nevi of trunk: Secondary | ICD-10-CM | POA: Diagnosis not present

## 2020-11-10 DIAGNOSIS — L570 Actinic keratosis: Secondary | ICD-10-CM | POA: Diagnosis not present

## 2020-11-10 DIAGNOSIS — Z08 Encounter for follow-up examination after completed treatment for malignant neoplasm: Secondary | ICD-10-CM | POA: Diagnosis not present

## 2020-11-10 DIAGNOSIS — Z85828 Personal history of other malignant neoplasm of skin: Secondary | ICD-10-CM | POA: Diagnosis not present

## 2020-11-10 DIAGNOSIS — X32XXXA Exposure to sunlight, initial encounter: Secondary | ICD-10-CM | POA: Diagnosis not present

## 2020-11-10 DIAGNOSIS — L821 Other seborrheic keratosis: Secondary | ICD-10-CM | POA: Diagnosis not present

## 2020-11-19 DIAGNOSIS — H25811 Combined forms of age-related cataract, right eye: Secondary | ICD-10-CM | POA: Diagnosis not present

## 2020-11-19 DIAGNOSIS — H2511 Age-related nuclear cataract, right eye: Secondary | ICD-10-CM | POA: Diagnosis not present

## 2020-12-17 DIAGNOSIS — D0590 Unspecified type of carcinoma in situ of unspecified breast: Secondary | ICD-10-CM | POA: Diagnosis not present

## 2020-12-17 DIAGNOSIS — H6123 Impacted cerumen, bilateral: Secondary | ICD-10-CM | POA: Diagnosis not present

## 2020-12-17 DIAGNOSIS — I2510 Atherosclerotic heart disease of native coronary artery without angina pectoris: Secondary | ICD-10-CM | POA: Diagnosis not present

## 2020-12-17 DIAGNOSIS — R011 Cardiac murmur, unspecified: Secondary | ICD-10-CM | POA: Diagnosis not present

## 2020-12-17 DIAGNOSIS — E785 Hyperlipidemia, unspecified: Secondary | ICD-10-CM | POA: Diagnosis not present

## 2020-12-17 DIAGNOSIS — R82998 Other abnormal findings in urine: Secondary | ICD-10-CM | POA: Diagnosis not present

## 2020-12-17 DIAGNOSIS — Z1389 Encounter for screening for other disorder: Secondary | ICD-10-CM | POA: Diagnosis not present

## 2020-12-17 DIAGNOSIS — K589 Irritable bowel syndrome without diarrhea: Secondary | ICD-10-CM | POA: Diagnosis not present

## 2020-12-17 DIAGNOSIS — J302 Other seasonal allergic rhinitis: Secondary | ICD-10-CM | POA: Diagnosis not present

## 2020-12-17 DIAGNOSIS — K760 Fatty (change of) liver, not elsewhere classified: Secondary | ICD-10-CM | POA: Diagnosis not present

## 2020-12-17 DIAGNOSIS — R7301 Impaired fasting glucose: Secondary | ICD-10-CM | POA: Diagnosis not present

## 2020-12-18 DIAGNOSIS — Z1212 Encounter for screening for malignant neoplasm of rectum: Secondary | ICD-10-CM | POA: Diagnosis not present

## 2021-02-01 DIAGNOSIS — R0981 Nasal congestion: Secondary | ICD-10-CM | POA: Diagnosis not present

## 2021-02-01 DIAGNOSIS — H6123 Impacted cerumen, bilateral: Secondary | ICD-10-CM | POA: Insufficient documentation

## 2021-02-18 DIAGNOSIS — Z961 Presence of intraocular lens: Secondary | ICD-10-CM | POA: Diagnosis not present

## 2021-02-18 DIAGNOSIS — H10413 Chronic giant papillary conjunctivitis, bilateral: Secondary | ICD-10-CM | POA: Diagnosis not present

## 2021-03-31 DIAGNOSIS — R051 Acute cough: Secondary | ICD-10-CM | POA: Diagnosis not present

## 2021-03-31 DIAGNOSIS — J302 Other seasonal allergic rhinitis: Secondary | ICD-10-CM | POA: Diagnosis not present

## 2021-03-31 DIAGNOSIS — Z1152 Encounter for screening for COVID-19: Secondary | ICD-10-CM | POA: Diagnosis not present

## 2021-03-31 DIAGNOSIS — J0100 Acute maxillary sinusitis, unspecified: Secondary | ICD-10-CM | POA: Diagnosis not present

## 2021-03-31 DIAGNOSIS — R509 Fever, unspecified: Secondary | ICD-10-CM | POA: Diagnosis not present

## 2021-04-29 DIAGNOSIS — L089 Local infection of the skin and subcutaneous tissue, unspecified: Secondary | ICD-10-CM | POA: Diagnosis not present

## 2021-04-29 DIAGNOSIS — D485 Neoplasm of uncertain behavior of skin: Secondary | ICD-10-CM | POA: Diagnosis not present

## 2021-04-29 DIAGNOSIS — B079 Viral wart, unspecified: Secondary | ICD-10-CM | POA: Diagnosis not present

## 2021-04-29 DIAGNOSIS — L728 Other follicular cysts of the skin and subcutaneous tissue: Secondary | ICD-10-CM | POA: Diagnosis not present

## 2021-05-07 IMAGING — US US RENAL
1 series · 14 of 25 positions shown · non-contrast
Comparison: Abdominal MRI November 24, 2016

CLINICAL DATA: Reported right renal cyst seen on prior outside MR

EXAM:
RENAL / URINARY TRACT ULTRASOUND COMPLETE

[Series 1: us renal · 0.28mm/px · 14 of 53 slices shown]
[im 1/53]
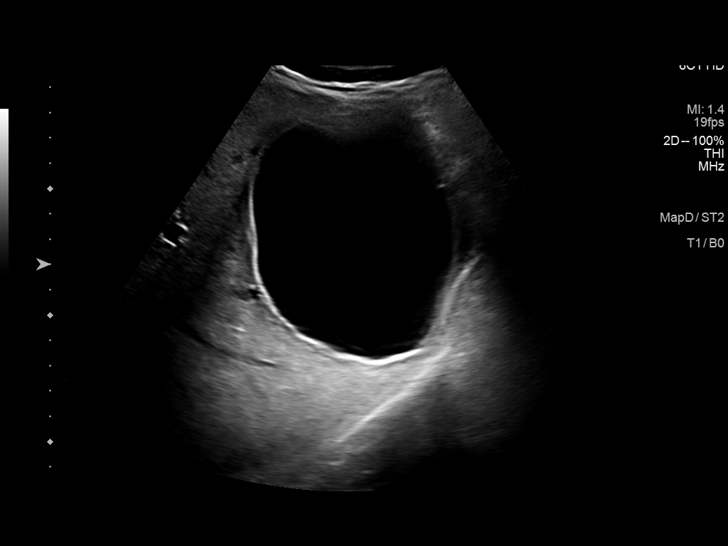
[im 5/53]
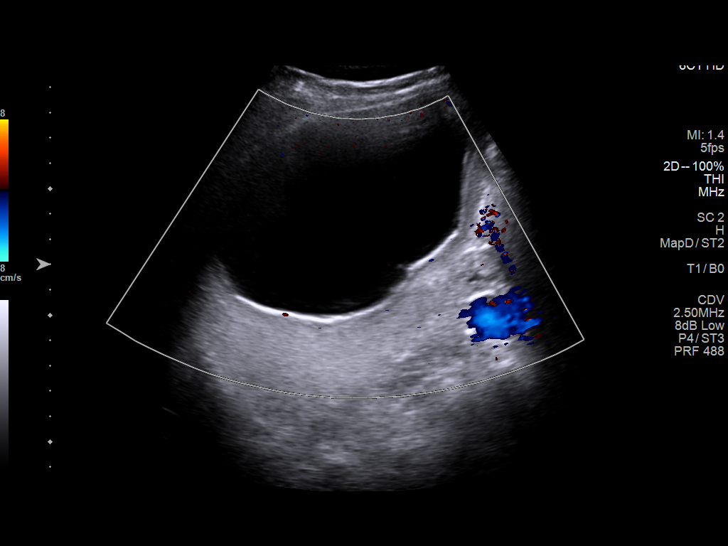
[im 9/53]
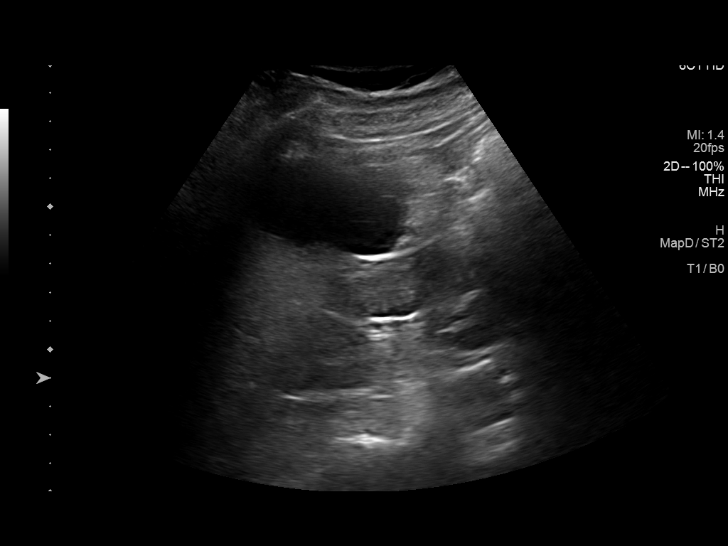
[im 14/53]
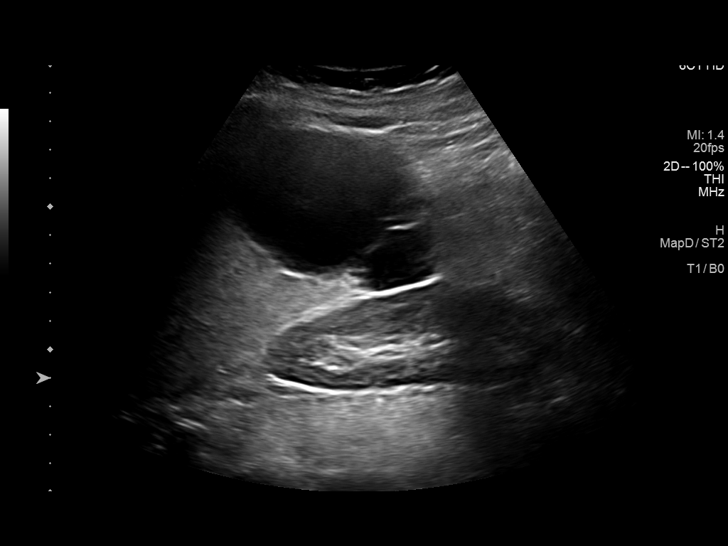
[im 18/53]
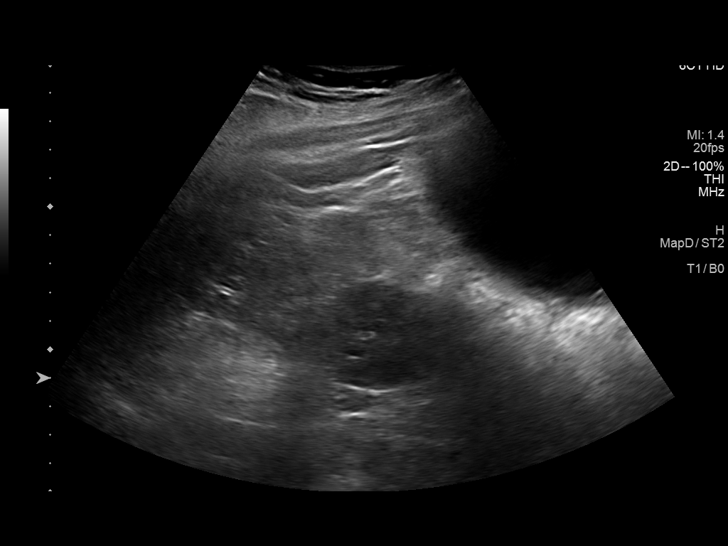
[im 20/53]
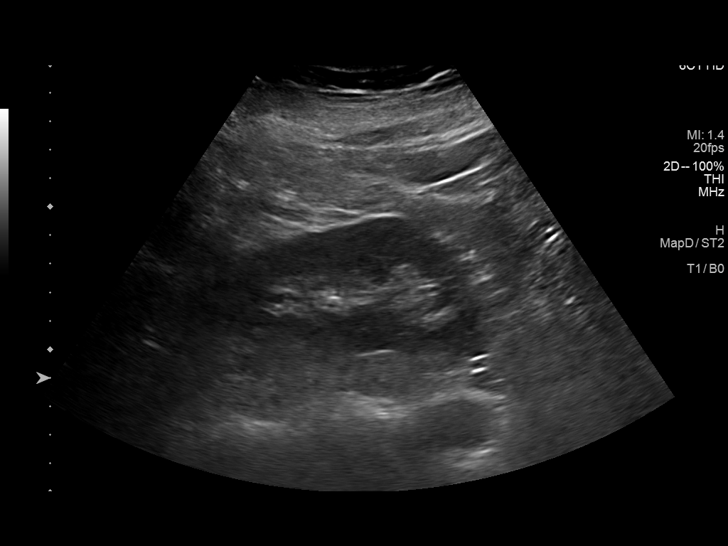
[im 24/53]
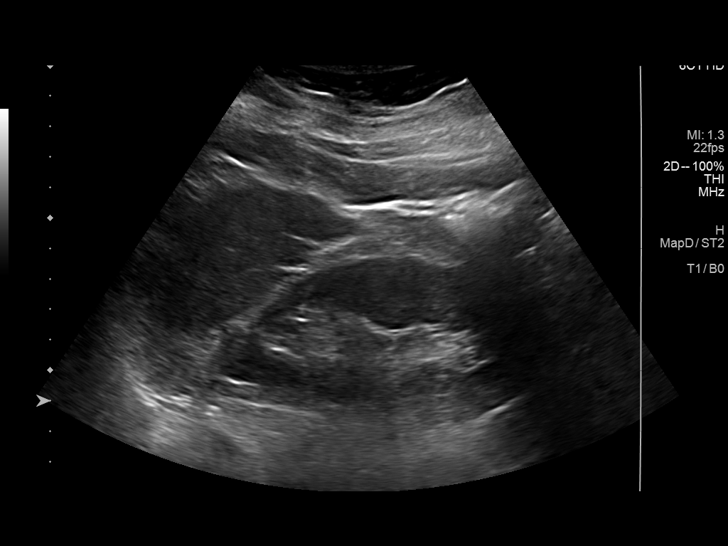
[im 29/53]
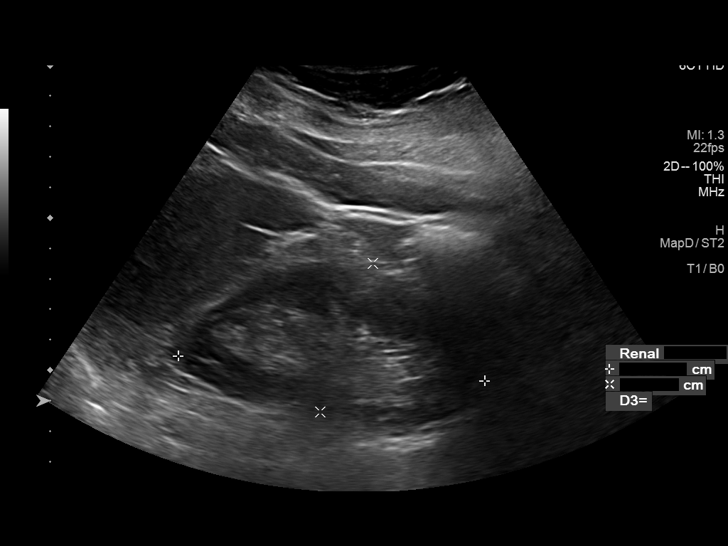
[im 33/53]
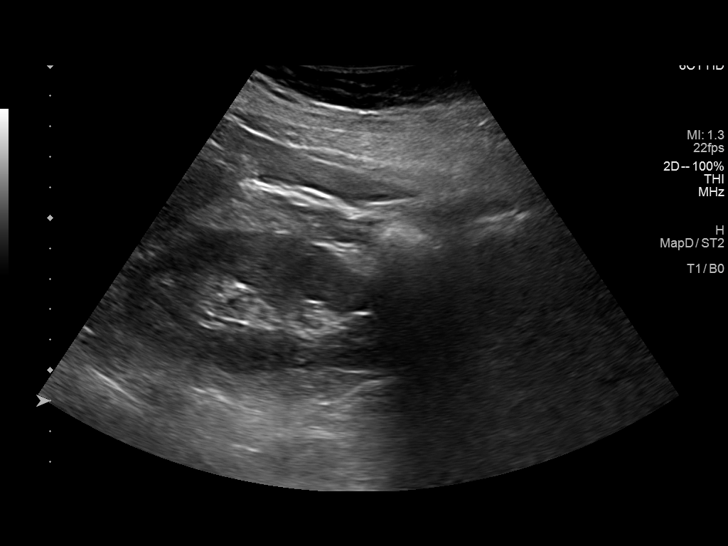
[im 35/53]
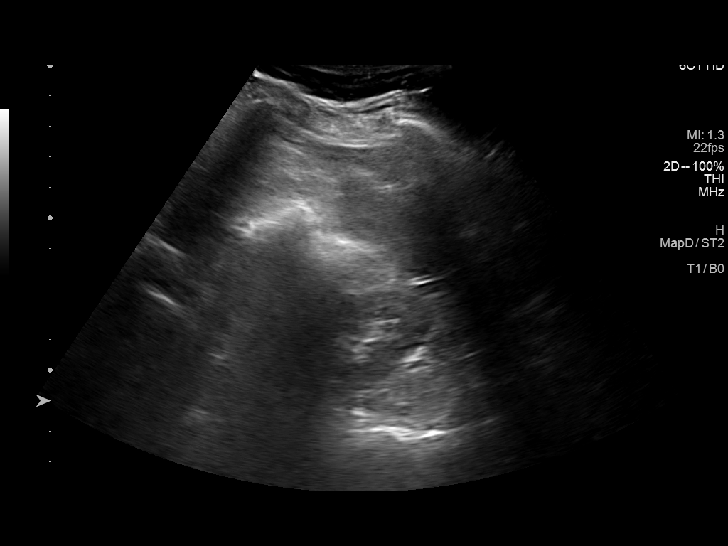
[im 40/53]
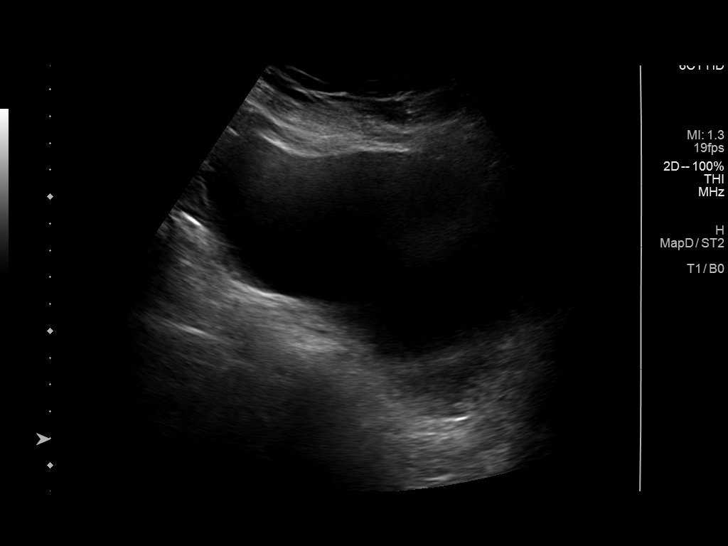
[im 44/53]
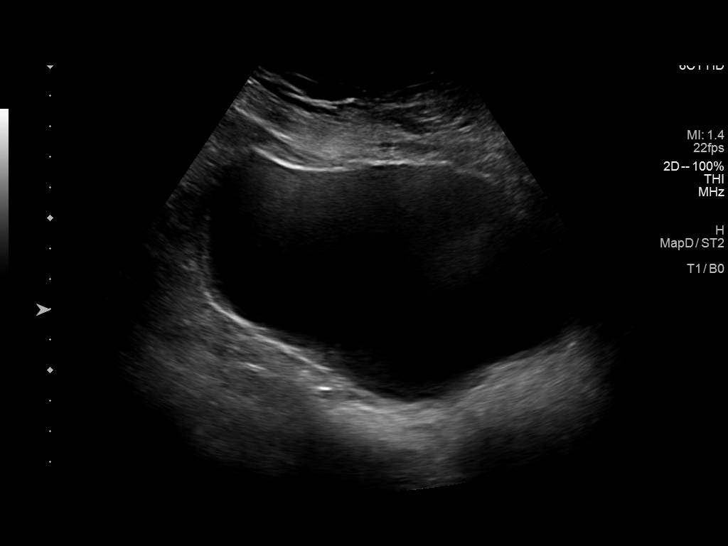
[im 48/53]
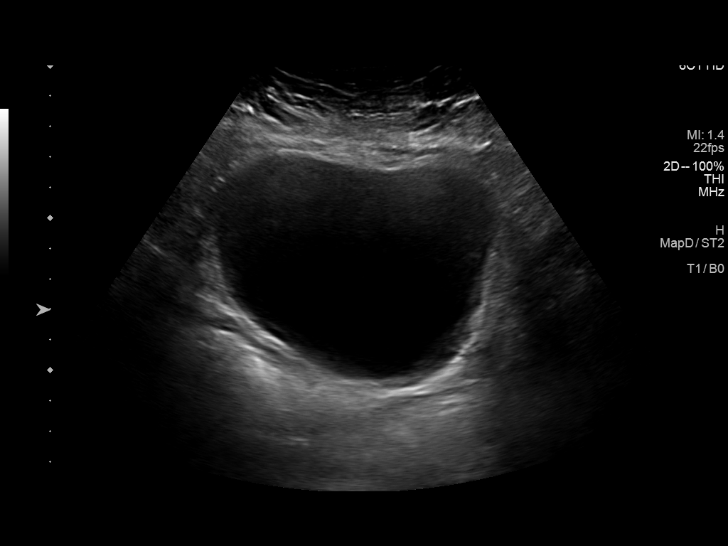
[im 53/53]
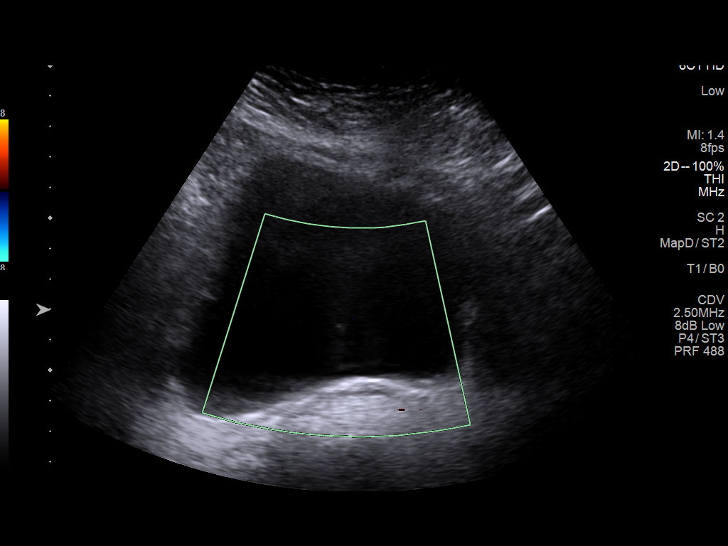

[14 of 25 positions shown; findings below may reference images not displayed]

FINDINGS: Right Kidney:

Renal measurements: 11.3 x 4.3 x 4.3 cm = volume: 110.3 mL .
Echogenicity and renal cortical thickness are within normal limits.
No mass, perinephric fluid, or hydronephrosis visualized. No
sonographically demonstrable calculus or ureterectasis.

Left Kidney:

Renal measurements: 10.1 x 5.2 x 5.0 cm = volume: 135.2 mL.
Echogenicity and renal cortical thickness are within normal limits.
No mass, perinephric fluid, or hydronephrosis visualized. No
sonographically demonstrable calculus or ureterectasis.

Bladder:

Appears normal for degree of bladder distention.

Other:

There is a cyst arising in the right lobe of the liver measuring
10.0 x 8.2 x 9.6 cm which contains occasional thin septation. This
cyst appears similar to findings on prior abdominal MRI from 2576.
IMPRESSION: Large cyst with occasional septations arising in the right lobe of
the liver. No renal masses are evident. Kidneys bilaterally appear
normal.

## 2021-10-19 DIAGNOSIS — L821 Other seborrheic keratosis: Secondary | ICD-10-CM | POA: Diagnosis not present

## 2021-10-19 DIAGNOSIS — Z808 Family history of malignant neoplasm of other organs or systems: Secondary | ICD-10-CM | POA: Diagnosis not present

## 2021-10-19 DIAGNOSIS — L538 Other specified erythematous conditions: Secondary | ICD-10-CM | POA: Diagnosis not present

## 2021-10-19 DIAGNOSIS — L578 Other skin changes due to chronic exposure to nonionizing radiation: Secondary | ICD-10-CM | POA: Diagnosis not present

## 2021-10-19 DIAGNOSIS — X32XXXA Exposure to sunlight, initial encounter: Secondary | ICD-10-CM | POA: Diagnosis not present

## 2021-10-19 DIAGNOSIS — Z85828 Personal history of other malignant neoplasm of skin: Secondary | ICD-10-CM | POA: Diagnosis not present

## 2021-10-19 DIAGNOSIS — D225 Melanocytic nevi of trunk: Secondary | ICD-10-CM | POA: Diagnosis not present

## 2021-10-19 DIAGNOSIS — L298 Other pruritus: Secondary | ICD-10-CM | POA: Diagnosis not present

## 2021-10-19 DIAGNOSIS — L820 Inflamed seborrheic keratosis: Secondary | ICD-10-CM | POA: Diagnosis not present

## 2021-10-19 DIAGNOSIS — Z08 Encounter for follow-up examination after completed treatment for malignant neoplasm: Secondary | ICD-10-CM | POA: Diagnosis not present

## 2021-10-19 DIAGNOSIS — L814 Other melanin hyperpigmentation: Secondary | ICD-10-CM | POA: Diagnosis not present

## 2021-10-19 DIAGNOSIS — L570 Actinic keratosis: Secondary | ICD-10-CM | POA: Diagnosis not present

## 2021-10-26 DIAGNOSIS — X32XXXA Exposure to sunlight, initial encounter: Secondary | ICD-10-CM | POA: Diagnosis not present

## 2021-10-26 DIAGNOSIS — L570 Actinic keratosis: Secondary | ICD-10-CM | POA: Diagnosis not present

## 2021-12-27 DIAGNOSIS — L821 Other seborrheic keratosis: Secondary | ICD-10-CM | POA: Diagnosis not present

## 2021-12-27 DIAGNOSIS — L578 Other skin changes due to chronic exposure to nonionizing radiation: Secondary | ICD-10-CM | POA: Diagnosis not present

## 2021-12-27 DIAGNOSIS — L814 Other melanin hyperpigmentation: Secondary | ICD-10-CM | POA: Diagnosis not present

## 2022-01-05 DIAGNOSIS — R7301 Impaired fasting glucose: Secondary | ICD-10-CM | POA: Diagnosis not present

## 2022-01-05 DIAGNOSIS — E785 Hyperlipidemia, unspecified: Secondary | ICD-10-CM | POA: Diagnosis not present

## 2022-01-05 DIAGNOSIS — I2510 Atherosclerotic heart disease of native coronary artery without angina pectoris: Secondary | ICD-10-CM | POA: Diagnosis not present

## 2022-01-05 DIAGNOSIS — Z0000 Encounter for general adult medical examination without abnormal findings: Secondary | ICD-10-CM | POA: Diagnosis not present

## 2022-01-12 DIAGNOSIS — K760 Fatty (change of) liver, not elsewhere classified: Secondary | ICD-10-CM | POA: Diagnosis not present

## 2022-01-12 DIAGNOSIS — R7301 Impaired fasting glucose: Secondary | ICD-10-CM | POA: Diagnosis not present

## 2022-01-12 DIAGNOSIS — K219 Gastro-esophageal reflux disease without esophagitis: Secondary | ICD-10-CM | POA: Diagnosis not present

## 2022-01-12 DIAGNOSIS — K589 Irritable bowel syndrome without diarrhea: Secondary | ICD-10-CM | POA: Diagnosis not present

## 2022-01-12 DIAGNOSIS — Z1331 Encounter for screening for depression: Secondary | ICD-10-CM | POA: Diagnosis not present

## 2022-01-12 DIAGNOSIS — E785 Hyperlipidemia, unspecified: Secondary | ICD-10-CM | POA: Diagnosis not present

## 2022-01-12 DIAGNOSIS — Z1339 Encounter for screening examination for other mental health and behavioral disorders: Secondary | ICD-10-CM | POA: Diagnosis not present

## 2022-01-12 DIAGNOSIS — R011 Cardiac murmur, unspecified: Secondary | ICD-10-CM | POA: Diagnosis not present

## 2022-01-12 DIAGNOSIS — R82998 Other abnormal findings in urine: Secondary | ICD-10-CM | POA: Diagnosis not present

## 2022-01-12 DIAGNOSIS — Z0000 Encounter for general adult medical examination without abnormal findings: Secondary | ICD-10-CM | POA: Diagnosis not present

## 2022-01-12 DIAGNOSIS — I2510 Atherosclerotic heart disease of native coronary artery without angina pectoris: Secondary | ICD-10-CM | POA: Diagnosis not present

## 2022-01-12 DIAGNOSIS — Z23 Encounter for immunization: Secondary | ICD-10-CM | POA: Diagnosis not present

## 2022-02-23 DIAGNOSIS — Z961 Presence of intraocular lens: Secondary | ICD-10-CM | POA: Diagnosis not present

## 2022-02-23 DIAGNOSIS — H00025 Hordeolum internum left lower eyelid: Secondary | ICD-10-CM | POA: Diagnosis not present

## 2022-05-16 DIAGNOSIS — R7989 Other specified abnormal findings of blood chemistry: Secondary | ICD-10-CM | POA: Diagnosis not present

## 2022-05-16 DIAGNOSIS — E2839 Other primary ovarian failure: Secondary | ICD-10-CM | POA: Diagnosis not present

## 2022-05-16 DIAGNOSIS — E785 Hyperlipidemia, unspecified: Secondary | ICD-10-CM | POA: Diagnosis not present

## 2022-05-18 DIAGNOSIS — D485 Neoplasm of uncertain behavior of skin: Secondary | ICD-10-CM | POA: Diagnosis not present

## 2022-05-18 DIAGNOSIS — L538 Other specified erythematous conditions: Secondary | ICD-10-CM | POA: Diagnosis not present

## 2022-05-18 DIAGNOSIS — L570 Actinic keratosis: Secondary | ICD-10-CM | POA: Diagnosis not present

## 2022-05-18 DIAGNOSIS — B078 Other viral warts: Secondary | ICD-10-CM | POA: Diagnosis not present

## 2022-05-18 DIAGNOSIS — X32XXXA Exposure to sunlight, initial encounter: Secondary | ICD-10-CM | POA: Diagnosis not present

## 2022-05-18 DIAGNOSIS — L820 Inflamed seborrheic keratosis: Secondary | ICD-10-CM | POA: Diagnosis not present

## 2022-06-08 DIAGNOSIS — L570 Actinic keratosis: Secondary | ICD-10-CM | POA: Diagnosis not present

## 2022-06-08 DIAGNOSIS — D485 Neoplasm of uncertain behavior of skin: Secondary | ICD-10-CM | POA: Diagnosis not present

## 2022-06-08 DIAGNOSIS — B079 Viral wart, unspecified: Secondary | ICD-10-CM | POA: Diagnosis not present

## 2022-06-21 DIAGNOSIS — T1591XA Foreign body on external eye, part unspecified, right eye, initial encounter: Secondary | ICD-10-CM | POA: Diagnosis not present

## 2022-07-21 DIAGNOSIS — B078 Other viral warts: Secondary | ICD-10-CM | POA: Diagnosis not present

## 2022-07-21 DIAGNOSIS — R238 Other skin changes: Secondary | ICD-10-CM | POA: Diagnosis not present

## 2022-07-21 DIAGNOSIS — L570 Actinic keratosis: Secondary | ICD-10-CM | POA: Diagnosis not present

## 2022-10-12 DIAGNOSIS — L821 Other seborrheic keratosis: Secondary | ICD-10-CM | POA: Diagnosis not present

## 2022-10-12 DIAGNOSIS — D0472 Carcinoma in situ of skin of left lower limb, including hip: Secondary | ICD-10-CM | POA: Diagnosis not present

## 2022-10-12 DIAGNOSIS — L814 Other melanin hyperpigmentation: Secondary | ICD-10-CM | POA: Diagnosis not present

## 2022-10-12 DIAGNOSIS — D225 Melanocytic nevi of trunk: Secondary | ICD-10-CM | POA: Diagnosis not present

## 2022-10-12 DIAGNOSIS — Z85828 Personal history of other malignant neoplasm of skin: Secondary | ICD-10-CM | POA: Diagnosis not present

## 2022-10-12 DIAGNOSIS — Z7189 Other specified counseling: Secondary | ICD-10-CM | POA: Diagnosis not present

## 2022-10-12 DIAGNOSIS — L570 Actinic keratosis: Secondary | ICD-10-CM | POA: Diagnosis not present

## 2022-10-12 DIAGNOSIS — L578 Other skin changes due to chronic exposure to nonionizing radiation: Secondary | ICD-10-CM | POA: Diagnosis not present

## 2022-10-12 DIAGNOSIS — D485 Neoplasm of uncertain behavior of skin: Secondary | ICD-10-CM | POA: Diagnosis not present

## 2022-10-12 DIAGNOSIS — C44729 Squamous cell carcinoma of skin of left lower limb, including hip: Secondary | ICD-10-CM | POA: Diagnosis not present

## 2022-10-12 DIAGNOSIS — Z08 Encounter for follow-up examination after completed treatment for malignant neoplasm: Secondary | ICD-10-CM | POA: Diagnosis not present

## 2022-10-27 DIAGNOSIS — C44729 Squamous cell carcinoma of skin of left lower limb, including hip: Secondary | ICD-10-CM | POA: Diagnosis not present

## 2022-11-10 DIAGNOSIS — L570 Actinic keratosis: Secondary | ICD-10-CM | POA: Diagnosis not present

## 2022-11-10 DIAGNOSIS — D0472 Carcinoma in situ of skin of left lower limb, including hip: Secondary | ICD-10-CM | POA: Diagnosis not present

## 2023-03-01 DIAGNOSIS — D0471 Carcinoma in situ of skin of right lower limb, including hip: Secondary | ICD-10-CM | POA: Diagnosis not present

## 2023-03-01 DIAGNOSIS — D692 Other nonthrombocytopenic purpura: Secondary | ICD-10-CM | POA: Diagnosis not present

## 2023-03-01 DIAGNOSIS — D485 Neoplasm of uncertain behavior of skin: Secondary | ICD-10-CM | POA: Diagnosis not present

## 2023-03-15 DIAGNOSIS — H00022 Hordeolum internum right lower eyelid: Secondary | ICD-10-CM | POA: Diagnosis not present

## 2023-03-15 DIAGNOSIS — Z961 Presence of intraocular lens: Secondary | ICD-10-CM | POA: Diagnosis not present

## 2023-03-22 DIAGNOSIS — D0471 Carcinoma in situ of skin of right lower limb, including hip: Secondary | ICD-10-CM | POA: Diagnosis not present

## 2023-03-31 DIAGNOSIS — E785 Hyperlipidemia, unspecified: Secondary | ICD-10-CM | POA: Diagnosis not present

## 2023-03-31 DIAGNOSIS — R7301 Impaired fasting glucose: Secondary | ICD-10-CM | POA: Diagnosis not present

## 2023-03-31 DIAGNOSIS — R7989 Other specified abnormal findings of blood chemistry: Secondary | ICD-10-CM | POA: Diagnosis not present

## 2023-03-31 DIAGNOSIS — Z1212 Encounter for screening for malignant neoplasm of rectum: Secondary | ICD-10-CM | POA: Diagnosis not present

## 2023-03-31 DIAGNOSIS — I2510 Atherosclerotic heart disease of native coronary artery without angina pectoris: Secondary | ICD-10-CM | POA: Diagnosis not present

## 2023-04-05 DIAGNOSIS — Z0000 Encounter for general adult medical examination without abnormal findings: Secondary | ICD-10-CM | POA: Diagnosis not present

## 2023-04-05 DIAGNOSIS — K219 Gastro-esophageal reflux disease without esophagitis: Secondary | ICD-10-CM | POA: Diagnosis not present

## 2023-04-05 DIAGNOSIS — K648 Other hemorrhoids: Secondary | ICD-10-CM | POA: Diagnosis not present

## 2023-04-05 DIAGNOSIS — R82998 Other abnormal findings in urine: Secondary | ICD-10-CM | POA: Diagnosis not present

## 2023-04-05 DIAGNOSIS — E785 Hyperlipidemia, unspecified: Secondary | ICD-10-CM | POA: Diagnosis not present

## 2023-04-05 DIAGNOSIS — I10 Essential (primary) hypertension: Secondary | ICD-10-CM | POA: Diagnosis not present

## 2023-04-05 DIAGNOSIS — M1990 Unspecified osteoarthritis, unspecified site: Secondary | ICD-10-CM | POA: Diagnosis not present

## 2023-04-05 DIAGNOSIS — J302 Other seasonal allergic rhinitis: Secondary | ICD-10-CM | POA: Diagnosis not present

## 2023-04-05 DIAGNOSIS — R7301 Impaired fasting glucose: Secondary | ICD-10-CM | POA: Diagnosis not present

## 2023-04-05 DIAGNOSIS — I2510 Atherosclerotic heart disease of native coronary artery without angina pectoris: Secondary | ICD-10-CM | POA: Diagnosis not present

## 2023-04-19 DIAGNOSIS — Z08 Encounter for follow-up examination after completed treatment for malignant neoplasm: Secondary | ICD-10-CM | POA: Diagnosis not present

## 2023-04-19 DIAGNOSIS — L570 Actinic keratosis: Secondary | ICD-10-CM | POA: Diagnosis not present

## 2023-04-19 DIAGNOSIS — L818 Other specified disorders of pigmentation: Secondary | ICD-10-CM | POA: Diagnosis not present

## 2023-04-19 DIAGNOSIS — X32XXXA Exposure to sunlight, initial encounter: Secondary | ICD-10-CM | POA: Diagnosis not present

## 2023-04-19 DIAGNOSIS — Z85828 Personal history of other malignant neoplasm of skin: Secondary | ICD-10-CM | POA: Diagnosis not present

## 2023-04-19 DIAGNOSIS — L821 Other seborrheic keratosis: Secondary | ICD-10-CM | POA: Diagnosis not present

## 2023-04-19 DIAGNOSIS — Z7189 Other specified counseling: Secondary | ICD-10-CM | POA: Diagnosis not present

## 2023-04-19 DIAGNOSIS — L814 Other melanin hyperpigmentation: Secondary | ICD-10-CM | POA: Diagnosis not present

## 2023-04-19 DIAGNOSIS — D225 Melanocytic nevi of trunk: Secondary | ICD-10-CM | POA: Diagnosis not present

## 2023-04-19 DIAGNOSIS — L578 Other skin changes due to chronic exposure to nonionizing radiation: Secondary | ICD-10-CM | POA: Diagnosis not present

## 2023-04-19 DIAGNOSIS — D485 Neoplasm of uncertain behavior of skin: Secondary | ICD-10-CM | POA: Diagnosis not present

## 2023-04-25 ENCOUNTER — Encounter: Payer: Self-pay | Admitting: Podiatry

## 2023-04-25 ENCOUNTER — Ambulatory Visit: Payer: PPO

## 2023-04-25 ENCOUNTER — Ambulatory Visit: Payer: PPO | Admitting: Podiatry

## 2023-04-25 DIAGNOSIS — M205X9 Other deformities of toe(s) (acquired), unspecified foot: Secondary | ICD-10-CM

## 2023-04-26 NOTE — Progress Notes (Signed)
Subjective:  Patient ID: Krista Carney, female    DOB: [birth year],  MRN: [id number] HPI Chief Complaint  Patient presents with   Toe Pain    3rd toe right - lateral border, skin was callused, pedicurist trimmed a lot back, but still tender   New Patient (Initial Visit)    77 y.o. female presents with the above complaint.   ROS: Denies fever chills nausea vomiting muscle aches pains calf pain back pain chest pain shortness of breath  Past Medical History:  Diagnosis Date   Cancer (HCC) 01/2006   DCIS Right Breast, lumpectomy   History of indigestion    Lichen sclerosus et atrophicus 10/2004   vulvar biopsy   Liver cyst    PONV (postoperative nausea and vomiting)    Spigelian hernia    SUI (stress urinary incontinence, female)    Past Surgical History:  Procedure Laterality Date   ABDOMINAL HYSTERECTOMY  1989   abdominoplasty, endo/fibroids/adh   AUGMENTATION MAMMAPLASTY Bilateral prior to breast cancer   implants, silicone   BREAST RECONSTRUCTION Bilateral 02/22/07   BREAST RECONSTRUCTION Bilateral 05/2011   BREAST SURGERY Bilateral 12/2009   mastectomy   COLONOSCOPY  fall 2013   normal recheck in 10 years   FOOT SURGERY Bilateral 1/97 & 1999   bunioectomy and hammer toe   LIPOSUCTION  2/97   incision repair    Current Outpatient Medications:    Evolocumab (REPATHA [place]), Inject into the skin., Disp: , Rfl:    Ascorbic Acid (VITAMIN C) 1000 MG tablet, Take 1,000 mg by mouth daily. , Disp: , Rfl:    cetirizine (ZYRTEC) 10 MG tablet, Take 10 mg by mouth daily., Disp: , Rfl:    cholecalciferol (VITAMIN D3) 25 MCG (1000 UNIT) tablet, Take 1,000 Units by mouth daily., Disp: , Rfl:    Coenzyme Q10 (CO Q 10 PO), Take 1 capsule by mouth daily. , Disp: , Rfl:    ezetimibe (ZETIA) 10 MG tablet, Take 10 mg by mouth daily., Disp: , Rfl:    ibuprofen (ADVIL) 600 MG tablet, Take 1 tablet (600 mg total) by mouth every 6 (six) hours as needed (for mild pain not relieved by other  medications.)., Disp: 30 tablet, Rfl: 0   MAGNESIUM PO, Take 1 tablet by mouth daily., Disp: , Rfl:    senna (SENOKOT) 8.6 MG TABS tablet, Take 1 tablet (8.6 mg total) by mouth 2 (two) times daily., Disp: 60 tablet, Rfl: 0   zolpidem (AMBIEN) 10 MG tablet, Take 5 mg by mouth at bedtime as needed for sleep. , Disp: , Rfl:   Allergies  Allergen Reactions   Cheratussin Ac [Guaifenesin-Codeine] Hives   Morphine And Codeine     Pt immune to morphine drip   Review of Systems Objective:  There were no vitals filed for this visit.  General: Well developed, nourished, in no acute distress, alert and oriented x3   Dermatological: Skin is warm, dry and supple bilateral. Nails x 10 are well maintained; remaining integument appears unremarkable at this time. There are no open sores, no preulcerative lesions, no rash or signs of infection present.  Vascular: Dorsalis Pedis artery and Posterior Tibial artery pedal pulses are 2/4 bilateral with immedate capillary fill time. Pedal hair growth present. No varicosities and no lower extremity edema present bilateral.   Neruologic: Grossly intact via light touch bilateral. Vibratory intact via tuning fork bilateral. Protective threshold with Semmes Wienstein monofilament intact to all pedal sites bilateral. Patellar and Achilles deep tendon reflexes  2+ bilateral. No Babinski or clonus noted bilateral.   Musculoskeletal: No gross boney pedal deformities bilateral. No pain, crepitus, or limitation noted with foot and ankle range of motion bilateral. Muscular strength 5/5 in all groups tested bilateral.  Gait: Unassisted, Nonantalgic.    Radiographs:  None taken  Assessment & Plan:   Assessment: Mild mallet toe deformity third digit right foot resulting in hyperkeratosis along the lateral border of the nail fold.  Plan: We did discuss taking a portion of the nail but I explained to her that most likely it would bother her again because we probably would  not be able to get enough of the nail without the symptoms remaining.  We then alternatively discussed a flexor tenotomy at the DIPJ.  We will go ahead and set her up for that as she likes that plan better and we will have her schedule this on a late afternoon.     Krista Carney, [place]

## 2023-05-16 ENCOUNTER — Ambulatory Visit: Payer: PPO | Admitting: Podiatry

## 2023-06-07 ENCOUNTER — Telehealth: Payer: Self-pay | Admitting: Podiatry

## 2023-06-07 NOTE — Telephone Encounter (Signed)
Pt called and cxled the appt on 11/12 for her Tenotomy procedure due to her husband has had a few procedures recently and she needs to be able to help him at the moment but will call to r/s appt. I did tell her to make sure when she calls to get it r/s to make it at 400pm and has to be a 30 minute appt. She said she would.

## 2023-06-20 ENCOUNTER — Ambulatory Visit: Payer: PPO | Admitting: Podiatry

## 2023-08-17 DIAGNOSIS — L570 Actinic keratosis: Secondary | ICD-10-CM | POA: Diagnosis not present

## 2023-08-17 DIAGNOSIS — X32XXXA Exposure to sunlight, initial encounter: Secondary | ICD-10-CM | POA: Diagnosis not present

## 2023-08-29 DIAGNOSIS — R5383 Other fatigue: Secondary | ICD-10-CM | POA: Diagnosis not present

## 2023-08-29 DIAGNOSIS — R058 Other specified cough: Secondary | ICD-10-CM | POA: Diagnosis not present

## 2023-08-29 DIAGNOSIS — J029 Acute pharyngitis, unspecified: Secondary | ICD-10-CM | POA: Diagnosis not present

## 2023-08-29 DIAGNOSIS — R0981 Nasal congestion: Secondary | ICD-10-CM | POA: Diagnosis not present

## 2023-08-29 DIAGNOSIS — Z1152 Encounter for screening for COVID-19: Secondary | ICD-10-CM | POA: Diagnosis not present

## 2023-08-29 DIAGNOSIS — J209 Acute bronchitis, unspecified: Secondary | ICD-10-CM | POA: Diagnosis not present

## 2023-09-07 DIAGNOSIS — L570 Actinic keratosis: Secondary | ICD-10-CM | POA: Diagnosis not present

## 2023-09-07 DIAGNOSIS — X32XXXA Exposure to sunlight, initial encounter: Secondary | ICD-10-CM | POA: Diagnosis not present

## 2023-09-21 DIAGNOSIS — L570 Actinic keratosis: Secondary | ICD-10-CM | POA: Diagnosis not present

## 2023-09-21 DIAGNOSIS — X32XXXA Exposure to sunlight, initial encounter: Secondary | ICD-10-CM | POA: Diagnosis not present

## 2023-10-05 DIAGNOSIS — L570 Actinic keratosis: Secondary | ICD-10-CM | POA: Diagnosis not present

## 2023-10-05 DIAGNOSIS — X32XXXA Exposure to sunlight, initial encounter: Secondary | ICD-10-CM | POA: Diagnosis not present

## 2023-11-22 DIAGNOSIS — L820 Inflamed seborrheic keratosis: Secondary | ICD-10-CM | POA: Diagnosis not present

## 2023-11-22 DIAGNOSIS — L2989 Other pruritus: Secondary | ICD-10-CM | POA: Diagnosis not present

## 2023-11-22 DIAGNOSIS — X32XXXA Exposure to sunlight, initial encounter: Secondary | ICD-10-CM | POA: Diagnosis not present

## 2023-11-22 DIAGNOSIS — D2261 Melanocytic nevi of right upper limb, including shoulder: Secondary | ICD-10-CM | POA: Diagnosis not present

## 2023-11-22 DIAGNOSIS — L814 Other melanin hyperpigmentation: Secondary | ICD-10-CM | POA: Diagnosis not present

## 2023-11-22 DIAGNOSIS — L538 Other specified erythematous conditions: Secondary | ICD-10-CM | POA: Diagnosis not present

## 2023-11-22 DIAGNOSIS — Z08 Encounter for follow-up examination after completed treatment for malignant neoplasm: Secondary | ICD-10-CM | POA: Diagnosis not present

## 2023-11-22 DIAGNOSIS — L821 Other seborrheic keratosis: Secondary | ICD-10-CM | POA: Diagnosis not present

## 2023-11-22 DIAGNOSIS — L578 Other skin changes due to chronic exposure to nonionizing radiation: Secondary | ICD-10-CM | POA: Diagnosis not present

## 2023-11-22 DIAGNOSIS — L818 Other specified disorders of pigmentation: Secondary | ICD-10-CM | POA: Diagnosis not present

## 2023-11-22 DIAGNOSIS — Z7189 Other specified counseling: Secondary | ICD-10-CM | POA: Diagnosis not present

## 2023-11-22 DIAGNOSIS — Z85828 Personal history of other malignant neoplasm of skin: Secondary | ICD-10-CM | POA: Diagnosis not present

## 2024-03-14 DIAGNOSIS — L986 Other infiltrative disorders of the skin and subcutaneous tissue: Secondary | ICD-10-CM | POA: Diagnosis not present

## 2024-03-14 DIAGNOSIS — D485 Neoplasm of uncertain behavior of skin: Secondary | ICD-10-CM | POA: Diagnosis not present

## 2024-03-14 DIAGNOSIS — L570 Actinic keratosis: Secondary | ICD-10-CM | POA: Diagnosis not present

## 2024-05-27 DIAGNOSIS — L570 Actinic keratosis: Secondary | ICD-10-CM | POA: Diagnosis not present

## 2024-05-27 DIAGNOSIS — L738 Other specified follicular disorders: Secondary | ICD-10-CM | POA: Diagnosis not present

## 2024-05-27 DIAGNOSIS — Z08 Encounter for follow-up examination after completed treatment for malignant neoplasm: Secondary | ICD-10-CM | POA: Diagnosis not present

## 2024-05-27 DIAGNOSIS — L218 Other seborrheic dermatitis: Secondary | ICD-10-CM | POA: Diagnosis not present

## 2024-05-27 DIAGNOSIS — Z7189 Other specified counseling: Secondary | ICD-10-CM | POA: Diagnosis not present

## 2024-05-27 DIAGNOSIS — L814 Other melanin hyperpigmentation: Secondary | ICD-10-CM | POA: Diagnosis not present

## 2024-05-27 DIAGNOSIS — B078 Other viral warts: Secondary | ICD-10-CM | POA: Diagnosis not present

## 2024-05-27 DIAGNOSIS — X32XXXA Exposure to sunlight, initial encounter: Secondary | ICD-10-CM | POA: Diagnosis not present

## 2024-05-27 DIAGNOSIS — D1801 Hemangioma of skin and subcutaneous tissue: Secondary | ICD-10-CM | POA: Diagnosis not present

## 2024-05-27 DIAGNOSIS — D485 Neoplasm of uncertain behavior of skin: Secondary | ICD-10-CM | POA: Diagnosis not present

## 2024-05-27 DIAGNOSIS — L821 Other seborrheic keratosis: Secondary | ICD-10-CM | POA: Diagnosis not present

## 2024-05-27 DIAGNOSIS — Z85828 Personal history of other malignant neoplasm of skin: Secondary | ICD-10-CM | POA: Diagnosis not present

## 2024-06-20 DIAGNOSIS — E7849 Other hyperlipidemia: Secondary | ICD-10-CM | POA: Diagnosis not present

## 2024-07-03 DIAGNOSIS — E2839 Other primary ovarian failure: Secondary | ICD-10-CM | POA: Diagnosis not present

## 2024-07-03 DIAGNOSIS — R7301 Impaired fasting glucose: Secondary | ICD-10-CM | POA: Diagnosis not present

## 2024-07-03 DIAGNOSIS — Z9882 Breast implant status: Secondary | ICD-10-CM | POA: Diagnosis not present

## 2024-07-03 DIAGNOSIS — I2510 Atherosclerotic heart disease of native coronary artery without angina pectoris: Secondary | ICD-10-CM | POA: Diagnosis not present

## 2024-07-03 DIAGNOSIS — E785 Hyperlipidemia, unspecified: Secondary | ICD-10-CM | POA: Diagnosis not present

## 2024-07-03 DIAGNOSIS — Z1331 Encounter for screening for depression: Secondary | ICD-10-CM | POA: Diagnosis not present

## 2024-07-03 DIAGNOSIS — B0229 Other postherpetic nervous system involvement: Secondary | ICD-10-CM | POA: Diagnosis not present

## 2024-07-03 DIAGNOSIS — Z1339 Encounter for screening examination for other mental health and behavioral disorders: Secondary | ICD-10-CM | POA: Diagnosis not present

## 2024-07-03 DIAGNOSIS — Z0000 Encounter for general adult medical examination without abnormal findings: Secondary | ICD-10-CM | POA: Diagnosis not present

## 2024-07-03 DIAGNOSIS — M51369 Other intervertebral disc degeneration, lumbar region without mention of lumbar back pain or lower extremity pain: Secondary | ICD-10-CM | POA: Diagnosis not present

## 2024-07-03 DIAGNOSIS — K648 Other hemorrhoids: Secondary | ICD-10-CM | POA: Diagnosis not present

## 2024-07-03 DIAGNOSIS — K760 Fatty (change of) liver, not elsewhere classified: Secondary | ICD-10-CM | POA: Diagnosis not present

## 2024-07-03 DIAGNOSIS — M1990 Unspecified osteoarthritis, unspecified site: Secondary | ICD-10-CM | POA: Diagnosis not present

## 2024-07-03 DIAGNOSIS — R011 Cardiac murmur, unspecified: Secondary | ICD-10-CM | POA: Diagnosis not present

## 2024-07-11 DIAGNOSIS — K5900 Constipation, unspecified: Secondary | ICD-10-CM | POA: Diagnosis not present

## 2024-07-11 DIAGNOSIS — Z800 Family history of malignant neoplasm of digestive organs: Secondary | ICD-10-CM | POA: Diagnosis not present

## 2024-07-11 DIAGNOSIS — Z860101 Personal history of adenomatous and serrated colon polyps: Secondary | ICD-10-CM | POA: Diagnosis not present

## 2024-09-17 ENCOUNTER — Institutional Professional Consult (permissible substitution): Admitting: Plastic Surgery
